# Patient Record
Sex: Male | Born: 1950 | Race: White | Hispanic: No | Marital: Married | Smoking: Current every day smoker
Health system: Southern US, Community
[De-identification: ages and names within clinical notes are randomized; demographics above are authoritative.]

## PROBLEM LIST (undated history)

## (undated) DIAGNOSIS — E785 Hyperlipidemia, unspecified: Secondary | ICD-10-CM

## (undated) HISTORY — PX: SHOULDER SURGERY: SHX246

## (undated) HISTORY — DX: Hyperlipidemia, unspecified: E78.5

---

## 2000-12-12 ENCOUNTER — Encounter: Payer: Self-pay | Admitting: Specialist

## 2000-12-12 ENCOUNTER — Ambulatory Visit (HOSPITAL_COMMUNITY): Admission: RE | Admit: 2000-12-12 | Discharge: 2000-12-12 | Payer: Self-pay | Admitting: Specialist

## 2001-01-18 ENCOUNTER — Encounter: Payer: Self-pay | Admitting: Specialist

## 2001-01-18 ENCOUNTER — Observation Stay (HOSPITAL_COMMUNITY): Admission: RE | Admit: 2001-01-18 | Discharge: 2001-01-19 | Payer: Self-pay | Admitting: Specialist

## 2004-01-14 ENCOUNTER — Ambulatory Visit (HOSPITAL_COMMUNITY): Admission: RE | Admit: 2004-01-14 | Discharge: 2004-01-14 | Payer: Self-pay | Admitting: *Deleted

## 2012-12-18 HISTORY — PX: OTHER SURGICAL HISTORY: SHX169

## 2014-10-01 ENCOUNTER — Other Ambulatory Visit: Payer: Self-pay | Admitting: Family Medicine

## 2014-10-01 DIAGNOSIS — R0600 Dyspnea, unspecified: Secondary | ICD-10-CM

## 2014-10-02 ENCOUNTER — Ambulatory Visit (INDEPENDENT_AMBULATORY_CARE_PROVIDER_SITE_OTHER): Payer: PRIVATE HEALTH INSURANCE | Admitting: Internal Medicine

## 2014-10-02 DIAGNOSIS — R0600 Dyspnea, unspecified: Secondary | ICD-10-CM

## 2014-10-02 LAB — PULMONARY FUNCTION TEST
DL/VA % pred: 94 %
DL/VA: 4.41 ml/min/mmHg/L
DLCO unc % pred: 83 %
DLCO unc: 27.58 ml/min/mmHg
FEF 25-75 Post: 1.59 L/sec
FEF 25-75 Pre: 0.95 L/sec
FEF2575-%Change-Post: 67 %
FEF2575-%Pred-Post: 55 %
FEF2575-%Pred-Pre: 33 %
FEV1-%Change-Post: 24 %
FEV1-%Pred-Post: 63 %
FEV1-%Pred-Pre: 51 %
FEV1-Post: 2.26 L
FEV1-Pre: 1.82 L
FEV1FVC-%Change-Post: 8 %
FEV1FVC-%Pred-Pre: 75 %
FEV6-%Change-Post: 16 %
FEV6-%Pred-Post: 80 %
FEV6-%Pred-Pre: 68 %
FEV6-Post: 3.62 L
FEV6-Pre: 3.11 L
FEV6FVC-%Change-Post: 1 %
FEV6FVC-%Pred-Post: 103 %
FEV6FVC-%Pred-Pre: 102 %
FVC-%Change-Post: 14 %
FVC-%Pred-Post: 77 %
FVC-%Pred-Pre: 67 %
FVC-Post: 3.68 L
FVC-Pre: 3.21 L
Post FEV1/FVC ratio: 62 %
Post FEV6/FVC ratio: 98 %
Pre FEV1/FVC ratio: 57 %
Pre FEV6/FVC Ratio: 97 %

## 2014-10-02 NOTE — Progress Notes (Signed)
PFT done today. 

## 2016-09-15 DIAGNOSIS — Z23 Encounter for immunization: Secondary | ICD-10-CM | POA: Diagnosis not present

## 2016-12-12 DIAGNOSIS — L03114 Cellulitis of left upper limb: Secondary | ICD-10-CM | POA: Diagnosis not present

## 2016-12-12 DIAGNOSIS — M7022 Olecranon bursitis, left elbow: Secondary | ICD-10-CM | POA: Diagnosis not present

## 2017-07-25 DIAGNOSIS — Z125 Encounter for screening for malignant neoplasm of prostate: Secondary | ICD-10-CM | POA: Diagnosis not present

## 2017-07-25 DIAGNOSIS — J45909 Unspecified asthma, uncomplicated: Secondary | ICD-10-CM | POA: Diagnosis not present

## 2017-07-25 DIAGNOSIS — E669 Obesity, unspecified: Secondary | ICD-10-CM | POA: Diagnosis not present

## 2017-07-25 DIAGNOSIS — F17200 Nicotine dependence, unspecified, uncomplicated: Secondary | ICD-10-CM | POA: Diagnosis not present

## 2017-07-25 DIAGNOSIS — E785 Hyperlipidemia, unspecified: Secondary | ICD-10-CM | POA: Diagnosis not present

## 2017-07-25 DIAGNOSIS — Z23 Encounter for immunization: Secondary | ICD-10-CM | POA: Diagnosis not present

## 2017-07-25 DIAGNOSIS — Z0000 Encounter for general adult medical examination without abnormal findings: Secondary | ICD-10-CM | POA: Diagnosis not present

## 2017-07-25 DIAGNOSIS — I10 Essential (primary) hypertension: Secondary | ICD-10-CM | POA: Diagnosis not present

## 2017-07-25 DIAGNOSIS — M1990 Unspecified osteoarthritis, unspecified site: Secondary | ICD-10-CM | POA: Diagnosis not present

## 2017-07-26 ENCOUNTER — Other Ambulatory Visit: Payer: Self-pay | Admitting: Family Medicine

## 2017-07-26 DIAGNOSIS — Z136 Encounter for screening for cardiovascular disorders: Secondary | ICD-10-CM

## 2017-07-26 DIAGNOSIS — Z87891 Personal history of nicotine dependence: Secondary | ICD-10-CM

## 2017-08-07 ENCOUNTER — Ambulatory Visit
Admission: RE | Admit: 2017-08-07 | Discharge: 2017-08-07 | Disposition: A | Discharge status: Home / Self Care | Payer: Medicare Other | Source: Ambulatory Visit | Attending: Family Medicine | Admitting: Family Medicine

## 2017-08-07 DIAGNOSIS — Z136 Encounter for screening for cardiovascular disorders: Secondary | ICD-10-CM

## 2017-08-07 DIAGNOSIS — Z87891 Personal history of nicotine dependence: Secondary | ICD-10-CM

## 2017-09-05 DIAGNOSIS — H35033 Hypertensive retinopathy, bilateral: Secondary | ICD-10-CM | POA: Diagnosis not present

## 2017-09-06 DIAGNOSIS — H35371 Puckering of macula, right eye: Secondary | ICD-10-CM | POA: Diagnosis not present

## 2017-09-06 DIAGNOSIS — H4311 Vitreous hemorrhage, right eye: Secondary | ICD-10-CM | POA: Diagnosis not present

## 2017-09-06 DIAGNOSIS — H43813 Vitreous degeneration, bilateral: Secondary | ICD-10-CM | POA: Diagnosis not present

## 2017-09-13 DIAGNOSIS — H43813 Vitreous degeneration, bilateral: Secondary | ICD-10-CM | POA: Diagnosis not present

## 2017-09-13 DIAGNOSIS — H35371 Puckering of macula, right eye: Secondary | ICD-10-CM | POA: Diagnosis not present

## 2017-09-13 DIAGNOSIS — H4311 Vitreous hemorrhage, right eye: Secondary | ICD-10-CM | POA: Diagnosis not present

## 2017-09-18 DIAGNOSIS — K648 Other hemorrhoids: Secondary | ICD-10-CM | POA: Diagnosis not present

## 2017-09-18 DIAGNOSIS — K644 Residual hemorrhoidal skin tags: Secondary | ICD-10-CM | POA: Diagnosis not present

## 2017-09-18 DIAGNOSIS — D126 Benign neoplasm of colon, unspecified: Secondary | ICD-10-CM | POA: Diagnosis not present

## 2017-09-18 DIAGNOSIS — K5730 Diverticulosis of large intestine without perforation or abscess without bleeding: Secondary | ICD-10-CM | POA: Diagnosis not present

## 2017-09-18 DIAGNOSIS — Z1211 Encounter for screening for malignant neoplasm of colon: Secondary | ICD-10-CM | POA: Diagnosis not present

## 2017-09-20 DIAGNOSIS — D126 Benign neoplasm of colon, unspecified: Secondary | ICD-10-CM | POA: Diagnosis not present

## 2017-09-20 DIAGNOSIS — Z1211 Encounter for screening for malignant neoplasm of colon: Secondary | ICD-10-CM | POA: Diagnosis not present

## 2017-09-24 DIAGNOSIS — Z23 Encounter for immunization: Secondary | ICD-10-CM | POA: Diagnosis not present

## 2017-09-24 DIAGNOSIS — M7022 Olecranon bursitis, left elbow: Secondary | ICD-10-CM | POA: Diagnosis not present

## 2017-10-05 DIAGNOSIS — M75122 Complete rotator cuff tear or rupture of left shoulder, not specified as traumatic: Secondary | ICD-10-CM | POA: Diagnosis not present

## 2017-10-05 DIAGNOSIS — M25522 Pain in left elbow: Secondary | ICD-10-CM | POA: Diagnosis not present

## 2017-10-25 DIAGNOSIS — H35371 Puckering of macula, right eye: Secondary | ICD-10-CM | POA: Diagnosis not present

## 2017-10-25 DIAGNOSIS — H4311 Vitreous hemorrhage, right eye: Secondary | ICD-10-CM | POA: Diagnosis not present

## 2017-10-25 DIAGNOSIS — H43391 Other vitreous opacities, right eye: Secondary | ICD-10-CM | POA: Diagnosis not present

## 2017-10-25 DIAGNOSIS — H43813 Vitreous degeneration, bilateral: Secondary | ICD-10-CM | POA: Diagnosis not present

## 2018-01-21 ENCOUNTER — Telehealth: Payer: Self-pay | Admitting: Podiatry

## 2018-01-21 ENCOUNTER — Encounter: Payer: Self-pay | Admitting: Podiatry

## 2018-01-21 ENCOUNTER — Ambulatory Visit (INDEPENDENT_AMBULATORY_CARE_PROVIDER_SITE_OTHER): Payer: Medicare Other

## 2018-01-21 ENCOUNTER — Ambulatory Visit (INDEPENDENT_AMBULATORY_CARE_PROVIDER_SITE_OTHER): Payer: Medicare Other | Admitting: Podiatry

## 2018-01-21 VITALS — BP 130/94 | HR 77 | Temp 97.9°F | Resp 18

## 2018-01-21 DIAGNOSIS — M722 Plantar fascial fibromatosis: Secondary | ICD-10-CM | POA: Diagnosis not present

## 2018-01-21 MED ORDER — DICLOFENAC SODIUM 75 MG PO TBEC: 75.0000 mg | DELAYED_RELEASE_TABLET | Freq: Two times a day (BID) | ORAL | 3 refills | Status: DC

## 2018-01-21 MED ORDER — METHYLPREDNISOLONE 4 MG PO TBPK: ORAL_TABLET | ORAL | 0 refills | Status: DC

## 2018-01-21 MED ORDER — CELECOXIB 100 MG PO CAPS
100.0000 mg | ORAL_CAPSULE | Freq: Two times a day (BID) | ORAL | 3 refills | Status: DC
Start: 2018-01-21 — End: 2018-04-15

## 2018-01-21 NOTE — Progress Notes (Signed)
Subjective:  Patient ID: Michael Ballard, male    DOB: 1951-12-17,  MRN: [id number] HPI Chief Complaint  Patient presents with  . Foot Pain    Patient presents today for bilat feet/heel pain x 1 year off and on.  He reports right is much worse than left foot and when he walks, he has sharp stabbing pains and is worse after sitting for a while.  He has used ice and Epson salt soaks with some relief.  He also has severe pes planus bilat    67 y.o. male presents with the above complaint.     No past medical history on file.   Current Outpatient Medications:  .  amLODipine (NORVASC) 10 MG tablet, TK 1 T PO QD, Disp: , Rfl: 3 .  buPROPion (WELLBUTRIN XL) 150 MG 24 hr tablet, TK 1 T PO QAM TO HELP REDUCE SMOKING URGES, Disp: , Rfl: 7 .  meloxicam (MOBIC) 15 MG tablet, TK 1 T PO QD IN THE MORNING WF P, Disp: , Rfl: 3  No Known Allergies Review of Systems  Musculoskeletal: Positive for gait problem.  All other systems reviewed and are negative.  Objective:   Vitals:   01/21/18 1026  BP: (!) 130/94  Pulse: 77  Resp: 18  Temp: 97.9 F (36.6 C)    General: Well developed, nourished, in no acute distress, alert and oriented x3   Dermatological: Skin is warm, dry and supple bilateral. Nails x 10 are well maintained; remaining integument appears unremarkable at this time. There are no open sores, no preulcerative lesions, no rash or signs of infection present.  Vascular: Dorsalis Pedis artery and Posterior Tibial artery pedal pulses are 2/4 bilateral with immedate capillary fill time. Pedal hair growth present. No varicosities and no lower extremity edema present bilateral.   Neruologic: Grossly intact via light touch bilateral. Vibratory intact via tuning fork bilateral. Protective threshold with Semmes Wienstein monofilament intact to all pedal sites bilateral. Patellar and Achilles deep tendon reflexes 2+ bilateral. No Babinski or clonus noted bilateral.   Musculoskeletal: No  gross boney pedal deformities bilateral. No pain, crepitus, or limitation noted with foot and ankle range of motion bilateral. Muscular strength 5/5 in all groups tested bilateral.  Severe pes planus with pain on palpation of Lisfranc's joints.  He also has pain on palpation medial calcaneal tubercle of the right heel.   Gait: Unassisted, Nonantalgic.    Radiographs:  3 radiographs of the bilateral foot were taken today in the office demonstrating no acute findings.  However soft tissue increase in density at the plantar fascial kidney insertion site of the right heel is noted.  Severe flatfoot deformity is noted right greater than left.  Significant osteoarthritic changes of not only the subtalar joint of the the mid tarsal joint and Lisfranc joints.  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  Plan: Discussed etiology pathology conservative versus surgical therapies.  At this point he is going to bring me the brace that Dr. Gershon Mussel provided for him previously.  He is also going to allow me to place a cortisone injection to his right heel today after verbal consent and he was injected with 20 mg of Kenalog 5 mg Marcaine to the point of maximal tenderness understood sterile Betadine skin prep right foot.  He tolerated procedure well without complications.  We discussed appropriate shoe gear stretching exercises ice therapy as your modifications.  Wrote him on  a Medrol Dosepak to be followed by Celebrex 100 mg twice a day.  I will follow-up with him in 1 month.  He will call sooner if needed    Michael Ballard T. [place], [place]

## 2018-01-21 NOTE — Telephone Encounter (Signed)
Patient was here today, Dr. Milinda Pointer called in Celebrex and he cannot afford it at his pharmacy. He would like something else called in that is similar of possible.

## 2018-01-21 NOTE — Telephone Encounter (Signed)
He could try diclofenac 75 mg bid #60 with 3 RF

## 2018-01-21 NOTE — Telephone Encounter (Signed)
Per Dr. Milinda Pointer, script for Diclofenac has been sent to pharmacy and patient has been notified of new script

## 2018-01-21 NOTE — Addendum Note (Signed)
Addended by: Graceann Congress D on: 01/21/2018 03:19 PM   Modules accepted: Orders

## 2018-01-30 DIAGNOSIS — M4722 Other spondylosis with radiculopathy, cervical region: Secondary | ICD-10-CM | POA: Diagnosis not present

## 2018-02-27 ENCOUNTER — Ambulatory Visit (INDEPENDENT_AMBULATORY_CARE_PROVIDER_SITE_OTHER): Payer: Medicare Other | Admitting: Podiatry

## 2018-02-27 ENCOUNTER — Encounter: Payer: Self-pay | Admitting: Podiatry

## 2018-02-27 DIAGNOSIS — M722 Plantar fascial fibromatosis: Secondary | ICD-10-CM

## 2018-02-27 NOTE — Progress Notes (Signed)
He presents today for follow-up of his plantar fasciitis states that he feels a little better.  Pains are not as sharp as they were the diclofenac seems to be helping.  Objective: Vital signs are stable alert and oriented x3.  Pulses are strongly palpable.  Neurologic sensorium is intact.  He has pain on palpation medial calcaneal tubercles bilateral.  No open lesions or wounds.  Assessment:.  Resolving plantar fasciitis bilateral.  Plan: Discussed etiology pathology conservative versus surgical therapies.  After consent I injected the bilateral heels today 20 mg Kenalog 5 mg Marcaine point maximal tenderness medial aspect of bilateral heel.  Tolerated procedure well without complications.  Follow-up with me 1 month continue all other conservative therapies.

## 2018-03-01 DIAGNOSIS — M25522 Pain in left elbow: Secondary | ICD-10-CM | POA: Diagnosis not present

## 2018-03-05 DIAGNOSIS — J0100 Acute maxillary sinusitis, unspecified: Secondary | ICD-10-CM | POA: Diagnosis not present

## 2018-03-05 DIAGNOSIS — J45909 Unspecified asthma, uncomplicated: Secondary | ICD-10-CM | POA: Diagnosis not present

## 2018-04-15 ENCOUNTER — Encounter: Payer: Self-pay | Admitting: Podiatry

## 2018-04-15 ENCOUNTER — Ambulatory Visit (INDEPENDENT_AMBULATORY_CARE_PROVIDER_SITE_OTHER): Payer: Medicare Other | Admitting: Podiatry

## 2018-04-15 DIAGNOSIS — M779 Enthesopathy, unspecified: Secondary | ICD-10-CM

## 2018-04-15 DIAGNOSIS — M722 Plantar fascial fibromatosis: Secondary | ICD-10-CM | POA: Diagnosis not present

## 2018-04-15 DIAGNOSIS — M7750 Other enthesopathy of unspecified foot: Secondary | ICD-10-CM

## 2018-04-15 MED ORDER — CELECOXIB 100 MG PO CAPS: 100.0000 mg | ORAL_CAPSULE | Freq: Two times a day (BID) | ORAL | 3 refills | Status: DC

## 2018-04-15 NOTE — Progress Notes (Signed)
He presents today for follow-up of plantar fasciitis.  He states that the fasciitis is doing much better however I have pain lighting here as he refers to the sinus tarsi and that really has not improved at all.  He also states that he did not start taking the Celebrex.  He is wondering if taking Celebrex would help.  Objective:  Objective: Vital signs are stable alert and oriented x3 pulses are palpable.  He has severe pes planus with pain on palpation to the sinus tarsi of the bilateral foot minimal tenderness on palpation of the plantar fascia at its insertion site of the heel.  Assessment: Letter fasciitis with subtalar joint capsulitis.  Plan: After sterile Betadine skin prep I injected 20 mg Kenalog 5 mg Marcaine to the bilateral sinus tarsi's today.  He tolerated procedure well without complications.  I recommended that he start back on his Celebrex.  I will follow-up with him in 1 month if necessary.

## 2018-05-27 ENCOUNTER — Ambulatory Visit (INDEPENDENT_AMBULATORY_CARE_PROVIDER_SITE_OTHER): Payer: Medicare Other | Admitting: Podiatry

## 2018-05-27 ENCOUNTER — Encounter: Payer: Self-pay | Admitting: Podiatry

## 2018-05-27 DIAGNOSIS — M779 Enthesopathy, unspecified: Secondary | ICD-10-CM

## 2018-05-27 DIAGNOSIS — M7750 Other enthesopathy of unspecified foot: Secondary | ICD-10-CM

## 2018-05-27 NOTE — Progress Notes (Signed)
He presents today for follow-up of his subtalar joint capsulitis bilaterally states that they really doing pretty good but that Celebrex may be swell so up stop taking it.  Objective: Vital signs are stable he is alert and oriented x3 no pain on palpation of the sinus tarsi's bilateral or range of motion of the subtalar joints.  Severe posterior tibial tendon dysfunction with pes planus.  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  Resolution of capsulitis.  Plan: I will refer him to Liliane Channel to consider [place] braces and discussed this thoroughly with him he will follow-up with me after that.  I did put him in a compression anklet he tolerates that well.

## 2018-06-05 ENCOUNTER — Ambulatory Visit: Payer: Medicare Other | Admitting: Orthotics

## 2018-06-05 DIAGNOSIS — M722 Plantar fascial fibromatosis: Secondary | ICD-10-CM

## 2018-06-05 DIAGNOSIS — M7750 Other enthesopathy of unspecified foot: Secondary | ICD-10-CM

## 2018-06-05 NOTE — Progress Notes (Signed)
Patient was evaluated today for [place] brace due to gait instabilty, fear of falling, and severe PTTD/pes planus

## 2018-06-11 DIAGNOSIS — R319 Hematuria, unspecified: Secondary | ICD-10-CM | POA: Diagnosis not present

## 2018-06-11 DIAGNOSIS — Z720 Tobacco use: Secondary | ICD-10-CM | POA: Diagnosis not present

## 2018-06-12 DIAGNOSIS — R262 Difficulty in walking, not elsewhere classified: Secondary | ICD-10-CM | POA: Diagnosis not present

## 2018-06-12 DIAGNOSIS — M25571 Pain in right ankle and joints of right foot: Secondary | ICD-10-CM | POA: Diagnosis not present

## 2018-06-12 DIAGNOSIS — M25572 Pain in left ankle and joints of left foot: Secondary | ICD-10-CM | POA: Diagnosis not present

## 2018-06-13 ENCOUNTER — Other Ambulatory Visit: Payer: Self-pay | Admitting: Family Medicine

## 2018-06-13 DIAGNOSIS — R319 Hematuria, unspecified: Secondary | ICD-10-CM

## 2018-06-13 DIAGNOSIS — Z720 Tobacco use: Secondary | ICD-10-CM

## 2018-06-14 ENCOUNTER — Ambulatory Visit: Payer: Medicare Other | Admitting: Orthotics

## 2018-06-14 DIAGNOSIS — M7750 Other enthesopathy of unspecified foot: Secondary | ICD-10-CM

## 2018-06-14 NOTE — Progress Notes (Signed)
Patient presents today for evaluation/casting for AFO brace (tR).   Patient has hx of the following conditions: Gait instability,  Ankle instabilty,  Gait analysis done and patient displays abnormality of gait in both sagittial and frontal planes, and could benefit in aggressive ankle support.  Patient chose [place] brace w/ lace/speed laces.   Once he recv brace, he may be casted for L as well.

## 2018-06-17 ENCOUNTER — Other Ambulatory Visit: Payer: Self-pay | Admitting: Family Medicine

## 2018-06-17 DIAGNOSIS — R319 Hematuria, unspecified: Secondary | ICD-10-CM

## 2018-06-18 DIAGNOSIS — R262 Difficulty in walking, not elsewhere classified: Secondary | ICD-10-CM | POA: Diagnosis not present

## 2018-06-18 DIAGNOSIS — M25572 Pain in left ankle and joints of left foot: Secondary | ICD-10-CM | POA: Diagnosis not present

## 2018-06-18 DIAGNOSIS — M25571 Pain in right ankle and joints of right foot: Secondary | ICD-10-CM | POA: Diagnosis not present

## 2018-06-21 DIAGNOSIS — M25572 Pain in left ankle and joints of left foot: Secondary | ICD-10-CM | POA: Diagnosis not present

## 2018-06-21 DIAGNOSIS — M25571 Pain in right ankle and joints of right foot: Secondary | ICD-10-CM | POA: Diagnosis not present

## 2018-06-21 DIAGNOSIS — R262 Difficulty in walking, not elsewhere classified: Secondary | ICD-10-CM | POA: Diagnosis not present

## 2018-06-24 ENCOUNTER — Ambulatory Visit
Admission: RE | Admit: 2018-06-24 | Discharge: 2018-06-24 | Disposition: A | Discharge status: Home / Self Care | Payer: Medicare Other | Source: Ambulatory Visit | Attending: Family Medicine | Admitting: Family Medicine

## 2018-06-24 DIAGNOSIS — R319 Hematuria, unspecified: Secondary | ICD-10-CM

## 2018-06-25 DIAGNOSIS — M25572 Pain in left ankle and joints of left foot: Secondary | ICD-10-CM | POA: Diagnosis not present

## 2018-06-25 DIAGNOSIS — R262 Difficulty in walking, not elsewhere classified: Secondary | ICD-10-CM | POA: Diagnosis not present

## 2018-06-25 DIAGNOSIS — M25571 Pain in right ankle and joints of right foot: Secondary | ICD-10-CM | POA: Diagnosis not present

## 2018-06-27 DIAGNOSIS — M25571 Pain in right ankle and joints of right foot: Secondary | ICD-10-CM | POA: Diagnosis not present

## 2018-06-27 DIAGNOSIS — R262 Difficulty in walking, not elsewhere classified: Secondary | ICD-10-CM | POA: Diagnosis not present

## 2018-06-27 DIAGNOSIS — M25572 Pain in left ankle and joints of left foot: Secondary | ICD-10-CM | POA: Diagnosis not present

## 2018-07-02 DIAGNOSIS — M25571 Pain in right ankle and joints of right foot: Secondary | ICD-10-CM | POA: Diagnosis not present

## 2018-07-02 DIAGNOSIS — R262 Difficulty in walking, not elsewhere classified: Secondary | ICD-10-CM | POA: Diagnosis not present

## 2018-07-02 DIAGNOSIS — M25572 Pain in left ankle and joints of left foot: Secondary | ICD-10-CM | POA: Diagnosis not present

## 2018-07-09 DIAGNOSIS — M25572 Pain in left ankle and joints of left foot: Secondary | ICD-10-CM | POA: Diagnosis not present

## 2018-07-09 DIAGNOSIS — R262 Difficulty in walking, not elsewhere classified: Secondary | ICD-10-CM | POA: Diagnosis not present

## 2018-07-09 DIAGNOSIS — M25571 Pain in right ankle and joints of right foot: Secondary | ICD-10-CM | POA: Diagnosis not present

## 2018-07-11 DIAGNOSIS — R262 Difficulty in walking, not elsewhere classified: Secondary | ICD-10-CM | POA: Diagnosis not present

## 2018-07-11 DIAGNOSIS — M25572 Pain in left ankle and joints of left foot: Secondary | ICD-10-CM | POA: Diagnosis not present

## 2018-07-11 DIAGNOSIS — M25571 Pain in right ankle and joints of right foot: Secondary | ICD-10-CM | POA: Diagnosis not present

## 2018-07-22 DIAGNOSIS — Z125 Encounter for screening for malignant neoplasm of prostate: Secondary | ICD-10-CM | POA: Diagnosis not present

## 2018-07-22 DIAGNOSIS — R310 Gross hematuria: Secondary | ICD-10-CM | POA: Diagnosis not present

## 2018-07-22 DIAGNOSIS — D3500 Benign neoplasm of unspecified adrenal gland: Secondary | ICD-10-CM | POA: Diagnosis not present

## 2018-07-23 DIAGNOSIS — M25572 Pain in left ankle and joints of left foot: Secondary | ICD-10-CM | POA: Diagnosis not present

## 2018-07-23 DIAGNOSIS — R262 Difficulty in walking, not elsewhere classified: Secondary | ICD-10-CM | POA: Diagnosis not present

## 2018-07-23 DIAGNOSIS — M25571 Pain in right ankle and joints of right foot: Secondary | ICD-10-CM | POA: Diagnosis not present

## 2018-07-24 ENCOUNTER — Ambulatory Visit (INDEPENDENT_AMBULATORY_CARE_PROVIDER_SITE_OTHER): Payer: Medicare Other | Admitting: Orthotics

## 2018-07-24 DIAGNOSIS — M779 Enthesopathy, unspecified: Secondary | ICD-10-CM

## 2018-07-24 DIAGNOSIS — M2140 Flat foot [pes planus] (acquired), unspecified foot: Secondary | ICD-10-CM

## 2018-07-24 DIAGNOSIS — M76822 Posterior tibial tendinitis, left leg: Secondary | ICD-10-CM

## 2018-07-24 DIAGNOSIS — M722 Plantar fascial fibromatosis: Secondary | ICD-10-CM

## 2018-07-24 DIAGNOSIS — M76821 Posterior tibial tendinitis, right leg: Secondary | ICD-10-CM

## 2018-07-24 DIAGNOSIS — M7750 Other enthesopathy of unspecified foot: Secondary | ICD-10-CM

## 2018-07-24 NOTE — Progress Notes (Signed)
Patient came in today to pick up standard Afo brace.  Patient was evaluated for fit and function.   The brace fit very well and there were any complaints of the way it felt once donned.  The brace offered ankle stability in both saggital and coroneal planes.  Patient advised to always wear proper fitting shoes with brace. 

## 2018-07-25 DIAGNOSIS — R262 Difficulty in walking, not elsewhere classified: Secondary | ICD-10-CM | POA: Diagnosis not present

## 2018-07-25 DIAGNOSIS — M25572 Pain in left ankle and joints of left foot: Secondary | ICD-10-CM | POA: Diagnosis not present

## 2018-07-25 DIAGNOSIS — M25571 Pain in right ankle and joints of right foot: Secondary | ICD-10-CM | POA: Diagnosis not present

## 2018-07-25 DIAGNOSIS — D3500 Benign neoplasm of unspecified adrenal gland: Secondary | ICD-10-CM | POA: Diagnosis not present

## 2018-07-26 DIAGNOSIS — R310 Gross hematuria: Secondary | ICD-10-CM | POA: Diagnosis not present

## 2018-07-26 DIAGNOSIS — D3500 Benign neoplasm of unspecified adrenal gland: Secondary | ICD-10-CM | POA: Diagnosis not present

## 2018-07-26 DIAGNOSIS — R3129 Other microscopic hematuria: Secondary | ICD-10-CM | POA: Diagnosis not present

## 2018-07-30 DIAGNOSIS — R262 Difficulty in walking, not elsewhere classified: Secondary | ICD-10-CM | POA: Diagnosis not present

## 2018-07-30 DIAGNOSIS — M25572 Pain in left ankle and joints of left foot: Secondary | ICD-10-CM | POA: Diagnosis not present

## 2018-07-30 DIAGNOSIS — M25571 Pain in right ankle and joints of right foot: Secondary | ICD-10-CM | POA: Diagnosis not present

## 2018-08-01 DIAGNOSIS — M25571 Pain in right ankle and joints of right foot: Secondary | ICD-10-CM | POA: Diagnosis not present

## 2018-08-01 DIAGNOSIS — M25572 Pain in left ankle and joints of left foot: Secondary | ICD-10-CM | POA: Diagnosis not present

## 2018-08-01 DIAGNOSIS — R262 Difficulty in walking, not elsewhere classified: Secondary | ICD-10-CM | POA: Diagnosis not present

## 2018-08-06 DIAGNOSIS — M25571 Pain in right ankle and joints of right foot: Secondary | ICD-10-CM | POA: Diagnosis not present

## 2018-08-06 DIAGNOSIS — R262 Difficulty in walking, not elsewhere classified: Secondary | ICD-10-CM | POA: Diagnosis not present

## 2018-08-06 DIAGNOSIS — M25572 Pain in left ankle and joints of left foot: Secondary | ICD-10-CM | POA: Diagnosis not present

## 2018-08-08 DIAGNOSIS — M25572 Pain in left ankle and joints of left foot: Secondary | ICD-10-CM | POA: Diagnosis not present

## 2018-08-08 DIAGNOSIS — M25571 Pain in right ankle and joints of right foot: Secondary | ICD-10-CM | POA: Diagnosis not present

## 2018-08-08 DIAGNOSIS — R262 Difficulty in walking, not elsewhere classified: Secondary | ICD-10-CM | POA: Diagnosis not present

## 2018-08-13 DIAGNOSIS — I77819 Aortic ectasia, unspecified site: Secondary | ICD-10-CM | POA: Diagnosis not present

## 2018-08-13 DIAGNOSIS — I10 Essential (primary) hypertension: Secondary | ICD-10-CM | POA: Diagnosis not present

## 2018-08-13 DIAGNOSIS — M25572 Pain in left ankle and joints of left foot: Secondary | ICD-10-CM | POA: Diagnosis not present

## 2018-08-13 DIAGNOSIS — Z0000 Encounter for general adult medical examination without abnormal findings: Secondary | ICD-10-CM | POA: Diagnosis not present

## 2018-08-13 DIAGNOSIS — E785 Hyperlipidemia, unspecified: Secondary | ICD-10-CM | POA: Diagnosis not present

## 2018-08-13 DIAGNOSIS — R262 Difficulty in walking, not elsewhere classified: Secondary | ICD-10-CM | POA: Diagnosis not present

## 2018-08-13 DIAGNOSIS — Z23 Encounter for immunization: Secondary | ICD-10-CM | POA: Diagnosis not present

## 2018-08-13 DIAGNOSIS — M25571 Pain in right ankle and joints of right foot: Secondary | ICD-10-CM | POA: Diagnosis not present

## 2018-08-13 DIAGNOSIS — J410 Simple chronic bronchitis: Secondary | ICD-10-CM | POA: Diagnosis not present

## 2018-08-13 DIAGNOSIS — F17200 Nicotine dependence, unspecified, uncomplicated: Secondary | ICD-10-CM | POA: Diagnosis not present

## 2018-08-13 DIAGNOSIS — Z125 Encounter for screening for malignant neoplasm of prostate: Secondary | ICD-10-CM | POA: Diagnosis not present

## 2018-08-15 DIAGNOSIS — M25571 Pain in right ankle and joints of right foot: Secondary | ICD-10-CM | POA: Diagnosis not present

## 2018-08-15 DIAGNOSIS — R262 Difficulty in walking, not elsewhere classified: Secondary | ICD-10-CM | POA: Diagnosis not present

## 2018-08-15 DIAGNOSIS — M25572 Pain in left ankle and joints of left foot: Secondary | ICD-10-CM | POA: Diagnosis not present

## 2018-08-20 DIAGNOSIS — R262 Difficulty in walking, not elsewhere classified: Secondary | ICD-10-CM | POA: Diagnosis not present

## 2018-08-20 DIAGNOSIS — M25571 Pain in right ankle and joints of right foot: Secondary | ICD-10-CM | POA: Diagnosis not present

## 2018-08-20 DIAGNOSIS — M25572 Pain in left ankle and joints of left foot: Secondary | ICD-10-CM | POA: Diagnosis not present

## 2018-08-22 DIAGNOSIS — M25572 Pain in left ankle and joints of left foot: Secondary | ICD-10-CM | POA: Diagnosis not present

## 2018-08-22 DIAGNOSIS — R262 Difficulty in walking, not elsewhere classified: Secondary | ICD-10-CM | POA: Diagnosis not present

## 2018-08-22 DIAGNOSIS — M25571 Pain in right ankle and joints of right foot: Secondary | ICD-10-CM | POA: Diagnosis not present

## 2018-08-23 DIAGNOSIS — R310 Gross hematuria: Secondary | ICD-10-CM | POA: Diagnosis not present

## 2018-08-27 DIAGNOSIS — R262 Difficulty in walking, not elsewhere classified: Secondary | ICD-10-CM | POA: Diagnosis not present

## 2018-08-27 DIAGNOSIS — M25571 Pain in right ankle and joints of right foot: Secondary | ICD-10-CM | POA: Diagnosis not present

## 2018-08-27 DIAGNOSIS — M25572 Pain in left ankle and joints of left foot: Secondary | ICD-10-CM | POA: Diagnosis not present

## 2018-08-29 DIAGNOSIS — M25571 Pain in right ankle and joints of right foot: Secondary | ICD-10-CM | POA: Diagnosis not present

## 2018-08-29 DIAGNOSIS — R262 Difficulty in walking, not elsewhere classified: Secondary | ICD-10-CM | POA: Diagnosis not present

## 2018-08-29 DIAGNOSIS — M25572 Pain in left ankle and joints of left foot: Secondary | ICD-10-CM | POA: Diagnosis not present

## 2018-09-03 DIAGNOSIS — M25571 Pain in right ankle and joints of right foot: Secondary | ICD-10-CM | POA: Diagnosis not present

## 2018-09-03 DIAGNOSIS — M25572 Pain in left ankle and joints of left foot: Secondary | ICD-10-CM | POA: Diagnosis not present

## 2018-09-03 DIAGNOSIS — R262 Difficulty in walking, not elsewhere classified: Secondary | ICD-10-CM | POA: Diagnosis not present

## 2018-09-05 DIAGNOSIS — R262 Difficulty in walking, not elsewhere classified: Secondary | ICD-10-CM | POA: Diagnosis not present

## 2018-09-05 DIAGNOSIS — M25571 Pain in right ankle and joints of right foot: Secondary | ICD-10-CM | POA: Diagnosis not present

## 2018-09-05 DIAGNOSIS — M25572 Pain in left ankle and joints of left foot: Secondary | ICD-10-CM | POA: Diagnosis not present

## 2018-09-10 DIAGNOSIS — M25571 Pain in right ankle and joints of right foot: Secondary | ICD-10-CM | POA: Diagnosis not present

## 2018-09-10 DIAGNOSIS — R262 Difficulty in walking, not elsewhere classified: Secondary | ICD-10-CM | POA: Diagnosis not present

## 2018-09-10 DIAGNOSIS — M25572 Pain in left ankle and joints of left foot: Secondary | ICD-10-CM | POA: Diagnosis not present

## 2018-09-12 DIAGNOSIS — R262 Difficulty in walking, not elsewhere classified: Secondary | ICD-10-CM | POA: Diagnosis not present

## 2018-09-12 DIAGNOSIS — M25571 Pain in right ankle and joints of right foot: Secondary | ICD-10-CM | POA: Diagnosis not present

## 2018-09-12 DIAGNOSIS — M25572 Pain in left ankle and joints of left foot: Secondary | ICD-10-CM | POA: Diagnosis not present

## 2018-09-17 DIAGNOSIS — R262 Difficulty in walking, not elsewhere classified: Secondary | ICD-10-CM | POA: Diagnosis not present

## 2018-09-17 DIAGNOSIS — M25572 Pain in left ankle and joints of left foot: Secondary | ICD-10-CM | POA: Diagnosis not present

## 2018-09-17 DIAGNOSIS — M25571 Pain in right ankle and joints of right foot: Secondary | ICD-10-CM | POA: Diagnosis not present

## 2018-09-19 DIAGNOSIS — L570 Actinic keratosis: Secondary | ICD-10-CM | POA: Diagnosis not present

## 2018-09-19 DIAGNOSIS — L821 Other seborrheic keratosis: Secondary | ICD-10-CM | POA: Diagnosis not present

## 2018-09-19 DIAGNOSIS — Z85828 Personal history of other malignant neoplasm of skin: Secondary | ICD-10-CM | POA: Diagnosis not present

## 2018-09-19 DIAGNOSIS — C44319 Basal cell carcinoma of skin of other parts of face: Secondary | ICD-10-CM | POA: Diagnosis not present

## 2018-09-19 DIAGNOSIS — R262 Difficulty in walking, not elsewhere classified: Secondary | ICD-10-CM | POA: Diagnosis not present

## 2018-09-19 DIAGNOSIS — M25571 Pain in right ankle and joints of right foot: Secondary | ICD-10-CM | POA: Diagnosis not present

## 2018-09-19 DIAGNOSIS — D225 Melanocytic nevi of trunk: Secondary | ICD-10-CM | POA: Diagnosis not present

## 2018-09-19 DIAGNOSIS — M25572 Pain in left ankle and joints of left foot: Secondary | ICD-10-CM | POA: Diagnosis not present

## 2018-09-19 DIAGNOSIS — C4442 Squamous cell carcinoma of skin of scalp and neck: Secondary | ICD-10-CM | POA: Diagnosis not present

## 2018-09-19 DIAGNOSIS — C4401 Basal cell carcinoma of skin of lip: Secondary | ICD-10-CM | POA: Diagnosis not present

## 2018-09-24 DIAGNOSIS — M25571 Pain in right ankle and joints of right foot: Secondary | ICD-10-CM | POA: Diagnosis not present

## 2018-09-24 DIAGNOSIS — R262 Difficulty in walking, not elsewhere classified: Secondary | ICD-10-CM | POA: Diagnosis not present

## 2018-09-24 DIAGNOSIS — M25572 Pain in left ankle and joints of left foot: Secondary | ICD-10-CM | POA: Diagnosis not present

## 2018-09-26 DIAGNOSIS — M25572 Pain in left ankle and joints of left foot: Secondary | ICD-10-CM | POA: Diagnosis not present

## 2018-09-26 DIAGNOSIS — R262 Difficulty in walking, not elsewhere classified: Secondary | ICD-10-CM | POA: Diagnosis not present

## 2018-09-26 DIAGNOSIS — M25571 Pain in right ankle and joints of right foot: Secondary | ICD-10-CM | POA: Diagnosis not present

## 2018-10-01 DIAGNOSIS — M25571 Pain in right ankle and joints of right foot: Secondary | ICD-10-CM | POA: Diagnosis not present

## 2018-10-01 DIAGNOSIS — R262 Difficulty in walking, not elsewhere classified: Secondary | ICD-10-CM | POA: Diagnosis not present

## 2018-10-01 DIAGNOSIS — M25572 Pain in left ankle and joints of left foot: Secondary | ICD-10-CM | POA: Diagnosis not present

## 2018-10-03 DIAGNOSIS — R262 Difficulty in walking, not elsewhere classified: Secondary | ICD-10-CM | POA: Diagnosis not present

## 2018-10-03 DIAGNOSIS — M25571 Pain in right ankle and joints of right foot: Secondary | ICD-10-CM | POA: Diagnosis not present

## 2018-10-03 DIAGNOSIS — M25572 Pain in left ankle and joints of left foot: Secondary | ICD-10-CM | POA: Diagnosis not present

## 2018-10-08 DIAGNOSIS — M25572 Pain in left ankle and joints of left foot: Secondary | ICD-10-CM | POA: Diagnosis not present

## 2018-10-08 DIAGNOSIS — R262 Difficulty in walking, not elsewhere classified: Secondary | ICD-10-CM | POA: Diagnosis not present

## 2018-10-08 DIAGNOSIS — M25571 Pain in right ankle and joints of right foot: Secondary | ICD-10-CM | POA: Diagnosis not present

## 2018-10-10 DIAGNOSIS — M25572 Pain in left ankle and joints of left foot: Secondary | ICD-10-CM | POA: Diagnosis not present

## 2018-10-10 DIAGNOSIS — M25571 Pain in right ankle and joints of right foot: Secondary | ICD-10-CM | POA: Diagnosis not present

## 2018-10-10 DIAGNOSIS — R262 Difficulty in walking, not elsewhere classified: Secondary | ICD-10-CM | POA: Diagnosis not present

## 2018-10-15 DIAGNOSIS — R262 Difficulty in walking, not elsewhere classified: Secondary | ICD-10-CM | POA: Diagnosis not present

## 2018-10-15 DIAGNOSIS — M25572 Pain in left ankle and joints of left foot: Secondary | ICD-10-CM | POA: Diagnosis not present

## 2018-10-15 DIAGNOSIS — M25571 Pain in right ankle and joints of right foot: Secondary | ICD-10-CM | POA: Diagnosis not present

## 2018-10-17 DIAGNOSIS — M25572 Pain in left ankle and joints of left foot: Secondary | ICD-10-CM | POA: Diagnosis not present

## 2018-10-17 DIAGNOSIS — M25571 Pain in right ankle and joints of right foot: Secondary | ICD-10-CM | POA: Diagnosis not present

## 2018-10-17 DIAGNOSIS — R262 Difficulty in walking, not elsewhere classified: Secondary | ICD-10-CM | POA: Diagnosis not present

## 2018-10-23 DIAGNOSIS — C44329 Squamous cell carcinoma of skin of other parts of face: Secondary | ICD-10-CM | POA: Diagnosis not present

## 2018-10-23 DIAGNOSIS — Z85828 Personal history of other malignant neoplasm of skin: Secondary | ICD-10-CM | POA: Diagnosis not present

## 2018-10-23 DIAGNOSIS — L570 Actinic keratosis: Secondary | ICD-10-CM | POA: Diagnosis not present

## 2018-10-23 DIAGNOSIS — C44712 Basal cell carcinoma of skin of right lower limb, including hip: Secondary | ICD-10-CM | POA: Diagnosis not present

## 2018-10-23 DIAGNOSIS — L821 Other seborrheic keratosis: Secondary | ICD-10-CM | POA: Diagnosis not present

## 2018-10-29 DIAGNOSIS — C4401 Basal cell carcinoma of skin of lip: Secondary | ICD-10-CM | POA: Diagnosis not present

## 2018-10-29 DIAGNOSIS — Z85828 Personal history of other malignant neoplasm of skin: Secondary | ICD-10-CM | POA: Diagnosis not present

## 2018-12-23 DIAGNOSIS — L570 Actinic keratosis: Secondary | ICD-10-CM | POA: Diagnosis not present

## 2018-12-23 DIAGNOSIS — L821 Other seborrheic keratosis: Secondary | ICD-10-CM | POA: Diagnosis not present

## 2018-12-23 DIAGNOSIS — L720 Epidermal cyst: Secondary | ICD-10-CM | POA: Diagnosis not present

## 2018-12-23 DIAGNOSIS — Z85828 Personal history of other malignant neoplasm of skin: Secondary | ICD-10-CM | POA: Diagnosis not present

## 2019-01-08 DIAGNOSIS — M19011 Primary osteoarthritis, right shoulder: Secondary | ICD-10-CM | POA: Diagnosis not present

## 2019-01-08 DIAGNOSIS — M25522 Pain in left elbow: Secondary | ICD-10-CM | POA: Diagnosis not present

## 2019-01-08 DIAGNOSIS — M25521 Pain in right elbow: Secondary | ICD-10-CM | POA: Diagnosis not present

## 2019-04-23 DIAGNOSIS — L821 Other seborrheic keratosis: Secondary | ICD-10-CM | POA: Diagnosis not present

## 2019-04-23 DIAGNOSIS — L570 Actinic keratosis: Secondary | ICD-10-CM | POA: Diagnosis not present

## 2019-04-23 DIAGNOSIS — D225 Melanocytic nevi of trunk: Secondary | ICD-10-CM | POA: Diagnosis not present

## 2019-04-23 DIAGNOSIS — Z85828 Personal history of other malignant neoplasm of skin: Secondary | ICD-10-CM | POA: Diagnosis not present

## 2019-06-26 ENCOUNTER — Other Ambulatory Visit: Payer: Self-pay | Admitting: Family Medicine

## 2019-07-01 ENCOUNTER — Other Ambulatory Visit: Payer: Self-pay | Admitting: Family Medicine

## 2019-07-01 DIAGNOSIS — E278 Other specified disorders of adrenal gland: Secondary | ICD-10-CM

## 2019-07-09 ENCOUNTER — Ambulatory Visit
Admission: RE | Admit: 2019-07-09 | Discharge: 2019-07-09 | Disposition: A | Discharge status: Home / Self Care | Payer: Medicare Other | Source: Ambulatory Visit | Attending: Family Medicine | Admitting: Family Medicine

## 2019-07-09 ENCOUNTER — Other Ambulatory Visit: Payer: Self-pay

## 2019-07-09 DIAGNOSIS — E278 Other specified disorders of adrenal gland: Secondary | ICD-10-CM

## 2019-07-09 DIAGNOSIS — D3502 Benign neoplasm of left adrenal gland: Secondary | ICD-10-CM | POA: Diagnosis not present

## 2019-07-09 DIAGNOSIS — D3501 Benign neoplasm of right adrenal gland: Secondary | ICD-10-CM | POA: Diagnosis not present

## 2019-07-09 DIAGNOSIS — K5730 Diverticulosis of large intestine without perforation or abscess without bleeding: Secondary | ICD-10-CM | POA: Diagnosis not present

## 2019-07-17 DIAGNOSIS — K5790 Diverticulosis of intestine, part unspecified, without perforation or abscess without bleeding: Secondary | ICD-10-CM | POA: Diagnosis not present

## 2019-07-17 DIAGNOSIS — S22000A Wedge compression fracture of unspecified thoracic vertebra, initial encounter for closed fracture: Secondary | ICD-10-CM | POA: Diagnosis not present

## 2019-07-17 DIAGNOSIS — K7460 Unspecified cirrhosis of liver: Secondary | ICD-10-CM | POA: Diagnosis not present

## 2019-09-05 DIAGNOSIS — M542 Cervicalgia: Secondary | ICD-10-CM | POA: Diagnosis not present

## 2019-09-05 DIAGNOSIS — M546 Pain in thoracic spine: Secondary | ICD-10-CM | POA: Diagnosis not present

## 2019-10-08 DIAGNOSIS — Z23 Encounter for immunization: Secondary | ICD-10-CM | POA: Diagnosis not present

## 2019-10-28 DIAGNOSIS — D1801 Hemangioma of skin and subcutaneous tissue: Secondary | ICD-10-CM | POA: Diagnosis not present

## 2019-10-28 DIAGNOSIS — Z85828 Personal history of other malignant neoplasm of skin: Secondary | ICD-10-CM | POA: Diagnosis not present

## 2019-10-28 DIAGNOSIS — D0462 Carcinoma in situ of skin of left upper limb, including shoulder: Secondary | ICD-10-CM | POA: Diagnosis not present

## 2019-10-28 DIAGNOSIS — L821 Other seborrheic keratosis: Secondary | ICD-10-CM | POA: Diagnosis not present

## 2019-10-28 DIAGNOSIS — L570 Actinic keratosis: Secondary | ICD-10-CM | POA: Diagnosis not present

## 2019-11-10 ENCOUNTER — Other Ambulatory Visit: Payer: Self-pay

## 2019-11-10 DIAGNOSIS — Z20822 Contact with and (suspected) exposure to covid-19: Secondary | ICD-10-CM

## 2019-11-11 LAB — NOVEL CORONAVIRUS, NAA: SARS-CoV-2, NAA: NOT DETECTED

## 2020-05-04 DIAGNOSIS — L814 Other melanin hyperpigmentation: Secondary | ICD-10-CM | POA: Diagnosis not present

## 2020-05-04 DIAGNOSIS — D0471 Carcinoma in situ of skin of right lower limb, including hip: Secondary | ICD-10-CM | POA: Diagnosis not present

## 2020-05-04 DIAGNOSIS — D1801 Hemangioma of skin and subcutaneous tissue: Secondary | ICD-10-CM | POA: Diagnosis not present

## 2020-05-04 DIAGNOSIS — Z85828 Personal history of other malignant neoplasm of skin: Secondary | ICD-10-CM | POA: Diagnosis not present

## 2020-05-04 DIAGNOSIS — C44722 Squamous cell carcinoma of skin of right lower limb, including hip: Secondary | ICD-10-CM | POA: Diagnosis not present

## 2020-05-04 DIAGNOSIS — L570 Actinic keratosis: Secondary | ICD-10-CM | POA: Diagnosis not present

## 2020-05-04 DIAGNOSIS — D225 Melanocytic nevi of trunk: Secondary | ICD-10-CM | POA: Diagnosis not present

## 2020-05-04 DIAGNOSIS — L821 Other seborrheic keratosis: Secondary | ICD-10-CM | POA: Diagnosis not present

## 2020-07-02 IMAGING — CT CT ABD-PELV W/O CM
1 of 2 series · 14 of 32 positions shown, 19 images · non-contrast
Comparison: None.

CLINICAL DATA: Low back pain, hematuria.

EXAM:
CT ABDOMEN AND PELVIS WITHOUT CONTRAST
TECHNIQUE: Multidetector CT imaging of the abdomen and pelvis was performed
following the standard protocol without IV contrast.

[Series 2: renal standard/full · axial · 0.84mm/px · z∈[-356,+29]mm · 14 of 87 slices shown, 19 images]
[im 5/87  soft-tissue]
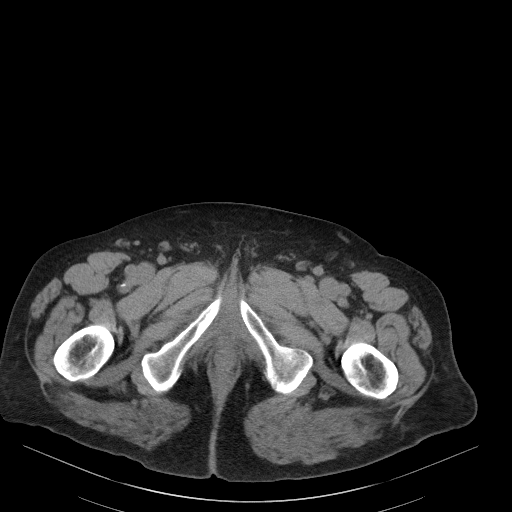
[im 5/87  bone]
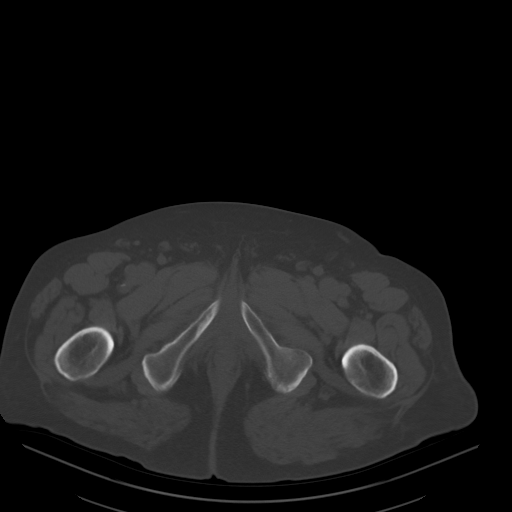
[im 10/87  soft-tissue]
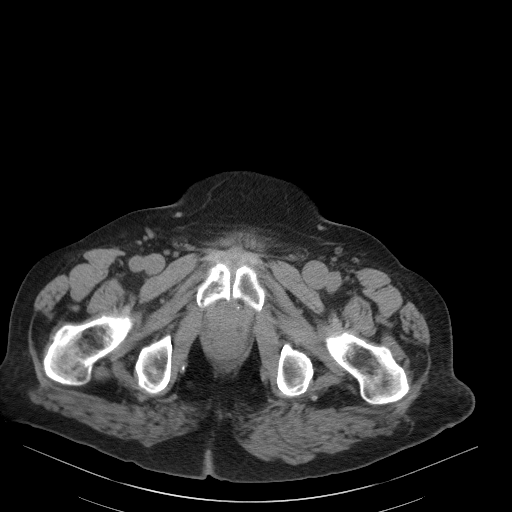
[im 20/87  soft-tissue]
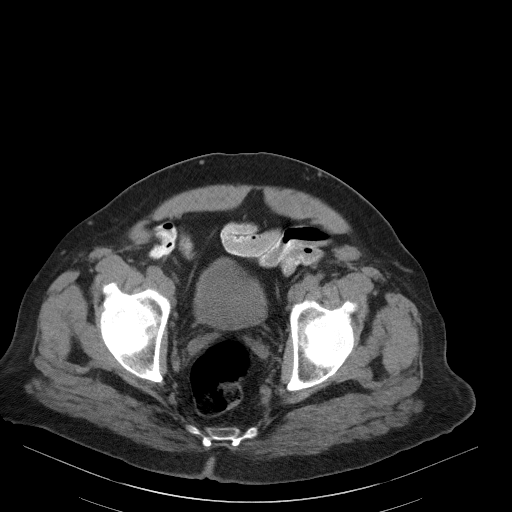
[im 24/87  soft-tissue]
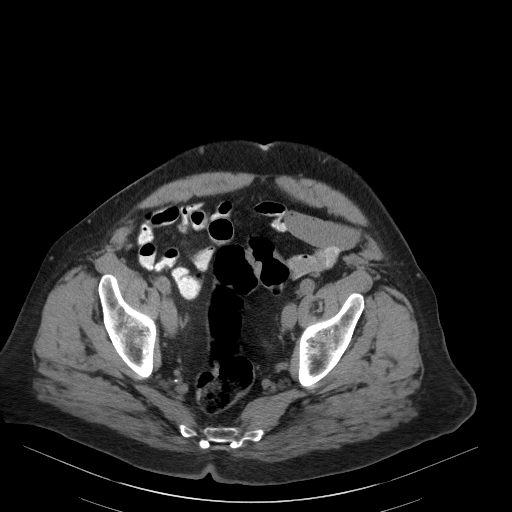
[im 29/87  soft-tissue]
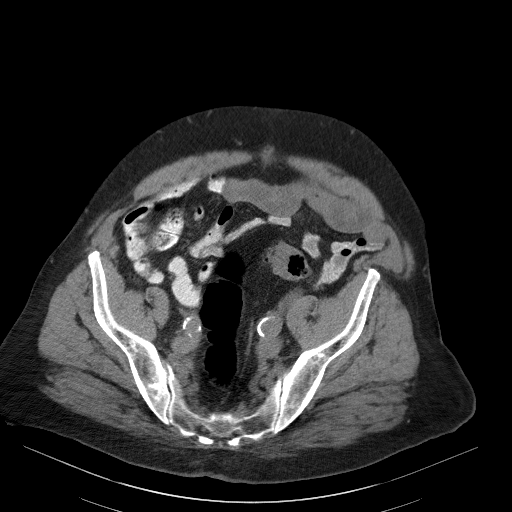
[im 39/87  soft-tissue]
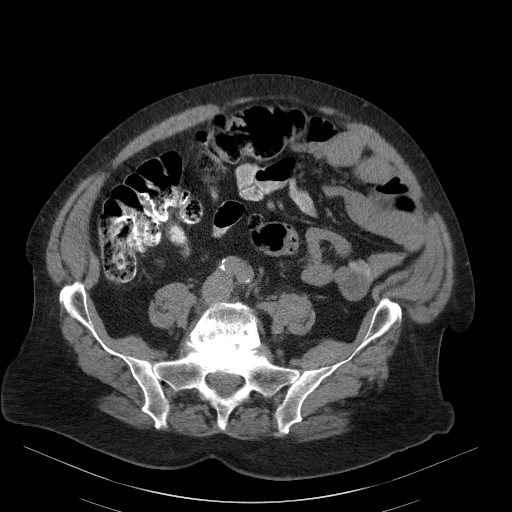
[im 44/87  soft-tissue]
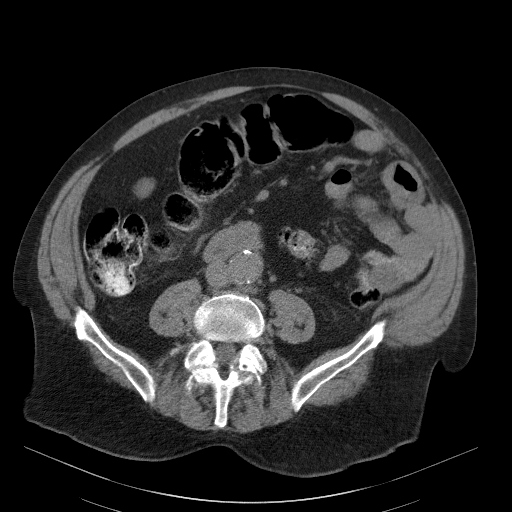
[im 48/87  soft-tissue]
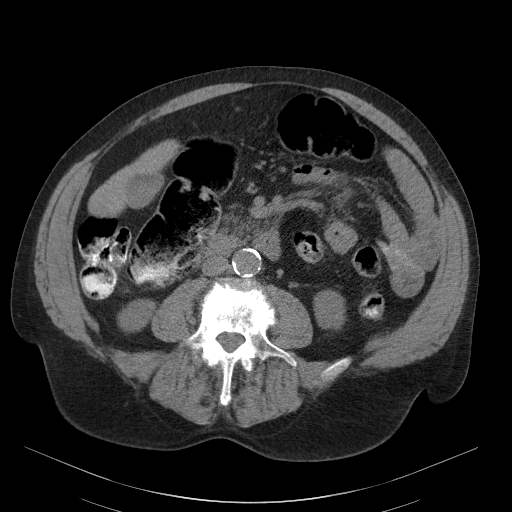
[im 58/87  soft-tissue]
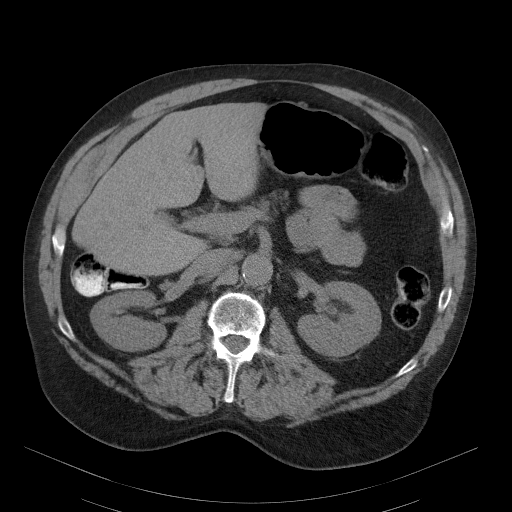
[im 58/87  bone]
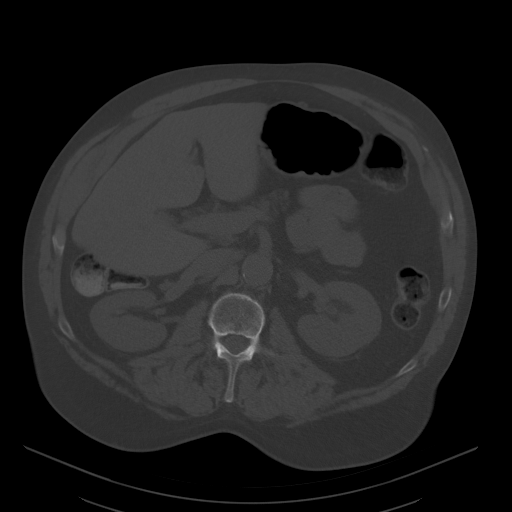
[im 63/87  soft-tissue]
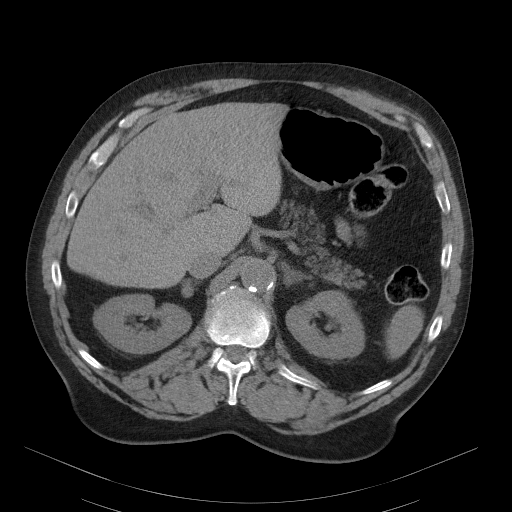
[im 67/87  soft-tissue]
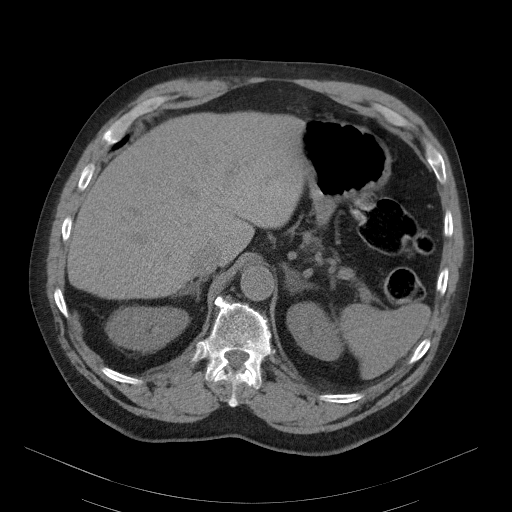
[im 67/87  lung]
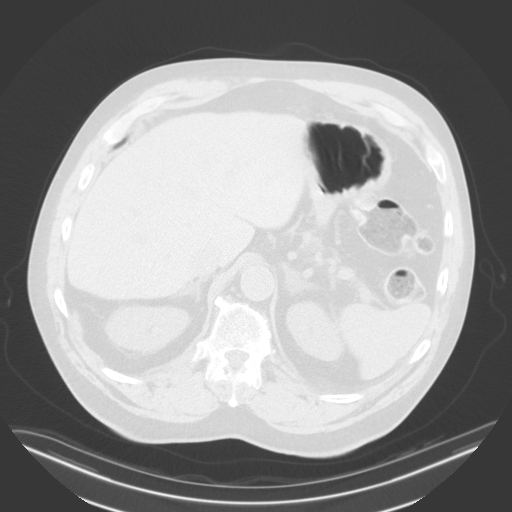
[im 72/87  lung]
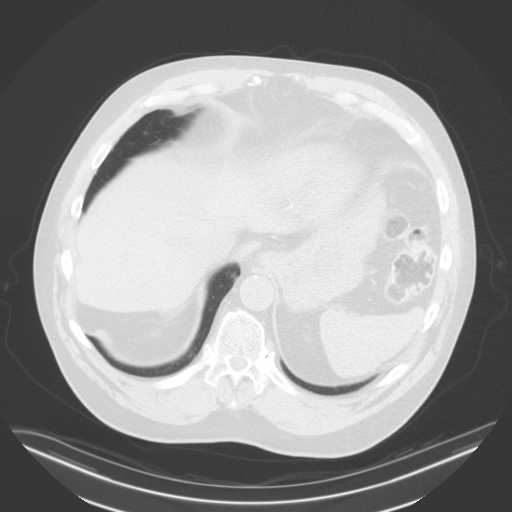
[im 77/87  soft-tissue]
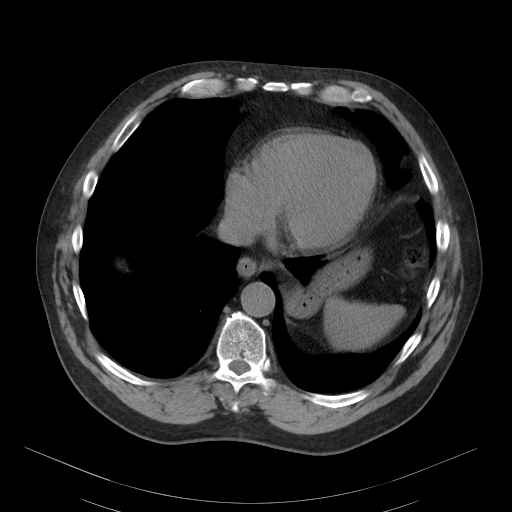
[im 77/87  lung]
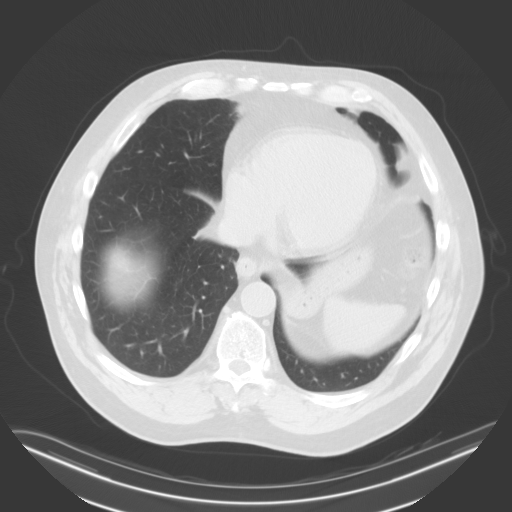
[im 82/87  soft-tissue]
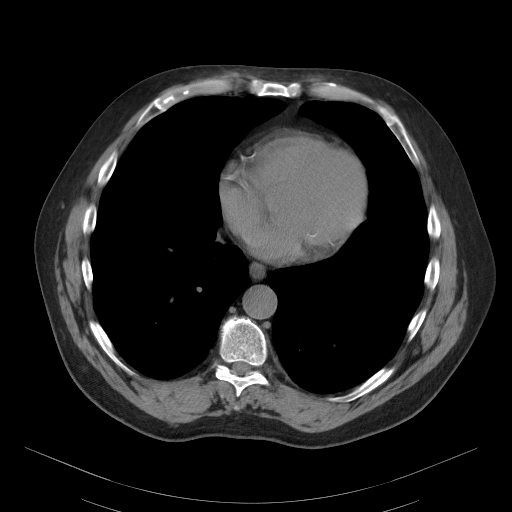
[im 82/87  lung]
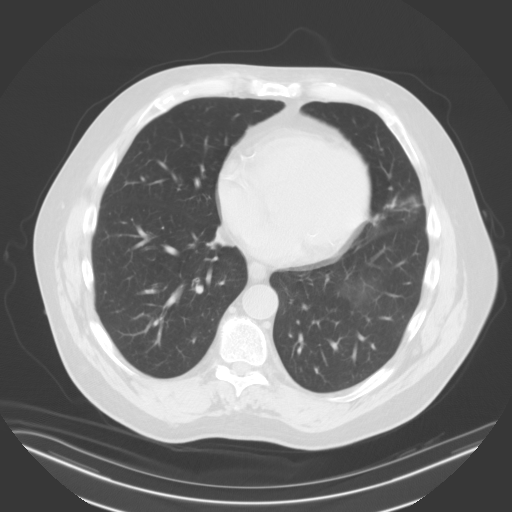

[14 of 32 positions shown; findings below may reference images not displayed]

FINDINGS: Lower chest: No acute abnormality.

Hepatobiliary: No focal liver abnormality is seen. No gallstones,
gallbladder wall thickening, or biliary dilatation.

Pancreas: Unremarkable. No pancreatic ductal dilatation or
surrounding inflammatory changes.

Spleen: Normal in size without focal abnormality.

Adrenals/Urinary Tract: Right adrenal gland appears normal. 2.4 cm
left adrenal nodule is noted. No hydronephrosis or renal obstruction
is noted. No renal or ureteral calculi are noted. Urinary bladder is
unremarkable.

Stomach/Bowel: Stomach is within normal limits. Appendix appears
normal. No evidence of bowel wall thickening, distention, or
inflammatory changes.

Vascular/Lymphatic: Aortic atherosclerosis. No enlarged abdominal or
pelvic lymph nodes.

Reproductive: Prostatic calcifications are noted. Normal prostate
size.

Other: No abdominal wall hernia or abnormality. No abdominopelvic
ascites.

Musculoskeletal: Grade 1 anterolisthesis of L5-S1 is noted secondary
to bilateral pars defects of L5. Multilevel degenerative disc
disease is noted. No acute osseous abnormality is noted.
IMPRESSION: 2.4 cm left adrenal nodule is noted. Follow-up CT or MRI scan in 12
months is recommended to ensure stability and rule out neoplasm.

Grade 1 anterolisthesis of L5-S1 is noted secondary to bilateral
pars defects of L5.

No other significant abnormality seen in the abdomen or pelvis.

Aortic Atherosclerosis ([id number]).

## 2020-08-19 DIAGNOSIS — R55 Syncope and collapse: Secondary | ICD-10-CM | POA: Diagnosis not present

## 2020-08-19 DIAGNOSIS — R5382 Chronic fatigue, unspecified: Secondary | ICD-10-CM | POA: Diagnosis not present

## 2020-08-19 DIAGNOSIS — Z23 Encounter for immunization: Secondary | ICD-10-CM | POA: Diagnosis not present

## 2020-08-19 DIAGNOSIS — R399 Unspecified symptoms and signs involving the genitourinary system: Secondary | ICD-10-CM | POA: Diagnosis not present

## 2020-09-02 ENCOUNTER — Other Ambulatory Visit: Payer: Self-pay | Admitting: Family Medicine

## 2020-09-02 ENCOUNTER — Other Ambulatory Visit (HOSPITAL_COMMUNITY): Payer: Self-pay | Admitting: Family Medicine

## 2020-09-02 DIAGNOSIS — R55 Syncope and collapse: Secondary | ICD-10-CM

## 2020-09-09 ENCOUNTER — Ambulatory Visit
Admission: RE | Admit: 2020-09-09 | Discharge: 2020-09-09 | Disposition: A | Discharge status: Home / Self Care | Payer: Medicare Other | Source: Ambulatory Visit | Attending: Family Medicine | Admitting: Family Medicine

## 2020-09-09 ENCOUNTER — Other Ambulatory Visit (HOSPITAL_COMMUNITY): Payer: Self-pay | Admitting: Family Medicine

## 2020-09-09 DIAGNOSIS — R55 Syncope and collapse: Secondary | ICD-10-CM | POA: Diagnosis not present

## 2020-09-09 DIAGNOSIS — I10 Essential (primary) hypertension: Secondary | ICD-10-CM | POA: Diagnosis not present

## 2020-09-09 DIAGNOSIS — I6523 Occlusion and stenosis of bilateral carotid arteries: Secondary | ICD-10-CM | POA: Diagnosis not present

## 2020-09-10 ENCOUNTER — Other Ambulatory Visit: Payer: Self-pay

## 2020-09-10 ENCOUNTER — Ambulatory Visit
Admission: RE | Admit: 2020-09-10 | Discharge: 2020-09-10 | Disposition: A | Discharge status: Home / Self Care | Payer: Medicare Other | Source: Ambulatory Visit | Attending: Family Medicine | Admitting: Family Medicine

## 2020-09-10 DIAGNOSIS — R55 Syncope and collapse: Secondary | ICD-10-CM

## 2020-09-10 DIAGNOSIS — Z135 Encounter for screening for eye and ear disorders: Secondary | ICD-10-CM | POA: Diagnosis not present

## 2020-09-10 DIAGNOSIS — Z01818 Encounter for other preprocedural examination: Secondary | ICD-10-CM | POA: Diagnosis not present

## 2020-09-10 DIAGNOSIS — G319 Degenerative disease of nervous system, unspecified: Secondary | ICD-10-CM | POA: Diagnosis not present

## 2020-09-14 ENCOUNTER — Inpatient Hospital Stay (HOSPITAL_COMMUNITY): Admission: RE | Admit: 2020-09-14 | Payer: Medicare Other | Source: Ambulatory Visit

## 2020-09-16 DIAGNOSIS — I10 Essential (primary) hypertension: Secondary | ICD-10-CM | POA: Diagnosis not present

## 2020-09-17 ENCOUNTER — Other Ambulatory Visit (HOSPITAL_COMMUNITY)
Admission: RE | Admit: 2020-09-17 | Discharge: 2020-09-17 | Disposition: A | Discharge status: Home / Self Care | Payer: Medicare Other | Source: Ambulatory Visit | Attending: Cardiovascular Disease | Admitting: Cardiovascular Disease

## 2020-09-17 ENCOUNTER — Encounter: Payer: Self-pay | Admitting: Cardiology

## 2020-09-17 ENCOUNTER — Telehealth (HOSPITAL_COMMUNITY): Payer: Self-pay | Admitting: *Deleted

## 2020-09-17 ENCOUNTER — Other Ambulatory Visit (HOSPITAL_COMMUNITY): Payer: Medicare Other

## 2020-09-17 DIAGNOSIS — Z20822 Contact with and (suspected) exposure to covid-19: Secondary | ICD-10-CM | POA: Diagnosis not present

## 2020-09-17 DIAGNOSIS — Z01812 Encounter for preprocedural laboratory examination: Secondary | ICD-10-CM | POA: Insufficient documentation

## 2020-09-17 LAB — SARS CORONAVIRUS 2 (TAT 6-24 HRS): SARS Coronavirus 2: NEGATIVE

## 2020-09-17 NOTE — Telephone Encounter (Signed)
Close encounter 

## 2020-09-17 NOTE — Progress Notes (Unsigned)
Patient ID: Michael Ballard, male   DOB: 01/03/1951, 69 y.o.   MRN: [id number]   Verified appointment "no show" status with Caryl Pina and Elmo Putt at 2:58NI.

## 2020-09-21 ENCOUNTER — Ambulatory Visit (HOSPITAL_COMMUNITY)
Admission: RE | Admit: 2020-09-21 | Payer: PRIVATE HEALTH INSURANCE | Source: Ambulatory Visit | Attending: Family Medicine | Admitting: Family Medicine

## 2020-09-22 DIAGNOSIS — M19211 Secondary osteoarthritis, right shoulder: Secondary | ICD-10-CM | POA: Diagnosis not present

## 2020-09-22 DIAGNOSIS — M19212 Secondary osteoarthritis, left shoulder: Secondary | ICD-10-CM | POA: Diagnosis not present

## 2020-09-24 ENCOUNTER — Telehealth (HOSPITAL_COMMUNITY): Payer: Self-pay | Admitting: Family Medicine

## 2020-09-24 NOTE — Telephone Encounter (Signed)
Patient does not want to have GXT due to his concerns of alling and does not wish to reschedule GXT. I asked him to reach out to Dr. Justin Mend and let her know and that she may possibly order another kind of stress test for him. He did state that he would be here for his echocardiogram. Order will be removed from the [facility] for the GXT. Thank you.

## 2020-10-07 ENCOUNTER — Ambulatory Visit (HOSPITAL_COMMUNITY): Payer: Medicare Other | Attending: Cardiology

## 2020-10-07 ENCOUNTER — Other Ambulatory Visit: Payer: Self-pay

## 2020-10-07 DIAGNOSIS — R55 Syncope and collapse: Secondary | ICD-10-CM | POA: Insufficient documentation

## 2020-10-07 LAB — ECHOCARDIOGRAM COMPLETE
Area-P 1/2: 2.99 cm2
S' Lateral: 3.2 cm

## 2020-11-02 DIAGNOSIS — Z23 Encounter for immunization: Secondary | ICD-10-CM | POA: Diagnosis not present

## 2020-11-04 DIAGNOSIS — L814 Other melanin hyperpigmentation: Secondary | ICD-10-CM | POA: Diagnosis not present

## 2020-11-04 DIAGNOSIS — L720 Epidermal cyst: Secondary | ICD-10-CM | POA: Diagnosis not present

## 2020-11-04 DIAGNOSIS — L820 Inflamed seborrheic keratosis: Secondary | ICD-10-CM | POA: Diagnosis not present

## 2020-11-04 DIAGNOSIS — C44519 Basal cell carcinoma of skin of other part of trunk: Secondary | ICD-10-CM | POA: Diagnosis not present

## 2020-11-04 DIAGNOSIS — L821 Other seborrheic keratosis: Secondary | ICD-10-CM | POA: Diagnosis not present

## 2020-11-04 DIAGNOSIS — D485 Neoplasm of uncertain behavior of skin: Secondary | ICD-10-CM | POA: Diagnosis not present

## 2020-11-04 DIAGNOSIS — Z85828 Personal history of other malignant neoplasm of skin: Secondary | ICD-10-CM | POA: Diagnosis not present

## 2020-11-04 DIAGNOSIS — D2371 Other benign neoplasm of skin of right lower limb, including hip: Secondary | ICD-10-CM | POA: Diagnosis not present

## 2020-11-04 DIAGNOSIS — L570 Actinic keratosis: Secondary | ICD-10-CM | POA: Diagnosis not present

## 2021-05-04 DIAGNOSIS — D485 Neoplasm of uncertain behavior of skin: Secondary | ICD-10-CM | POA: Diagnosis not present

## 2021-05-04 DIAGNOSIS — L821 Other seborrheic keratosis: Secondary | ICD-10-CM | POA: Diagnosis not present

## 2021-05-04 DIAGNOSIS — L814 Other melanin hyperpigmentation: Secondary | ICD-10-CM | POA: Diagnosis not present

## 2021-05-04 DIAGNOSIS — Z85828 Personal history of other malignant neoplasm of skin: Secondary | ICD-10-CM | POA: Diagnosis not present

## 2021-05-04 DIAGNOSIS — L570 Actinic keratosis: Secondary | ICD-10-CM | POA: Diagnosis not present

## 2021-05-26 DIAGNOSIS — Z23 Encounter for immunization: Secondary | ICD-10-CM | POA: Diagnosis not present

## 2021-10-14 DIAGNOSIS — Z23 Encounter for immunization: Secondary | ICD-10-CM | POA: Diagnosis not present

## 2021-10-14 DIAGNOSIS — H9122 Sudden idiopathic hearing loss, left ear: Secondary | ICD-10-CM | POA: Diagnosis not present

## 2021-10-14 DIAGNOSIS — H90A22 Sensorineural hearing loss, unilateral, left ear, with restricted hearing on the contralateral side: Secondary | ICD-10-CM | POA: Diagnosis not present

## 2021-10-14 DIAGNOSIS — H9120 Sudden idiopathic hearing loss, unspecified ear: Secondary | ICD-10-CM | POA: Diagnosis not present

## 2021-10-27 DIAGNOSIS — H90A22 Sensorineural hearing loss, unilateral, left ear, with restricted hearing on the contralateral side: Secondary | ICD-10-CM | POA: Diagnosis not present

## 2021-11-03 DIAGNOSIS — Z85828 Personal history of other malignant neoplasm of skin: Secondary | ICD-10-CM | POA: Diagnosis not present

## 2021-11-03 DIAGNOSIS — D1801 Hemangioma of skin and subcutaneous tissue: Secondary | ICD-10-CM | POA: Diagnosis not present

## 2021-11-03 DIAGNOSIS — D2371 Other benign neoplasm of skin of right lower limb, including hip: Secondary | ICD-10-CM | POA: Diagnosis not present

## 2021-11-03 DIAGNOSIS — L814 Other melanin hyperpigmentation: Secondary | ICD-10-CM | POA: Diagnosis not present

## 2021-11-03 DIAGNOSIS — L821 Other seborrheic keratosis: Secondary | ICD-10-CM | POA: Diagnosis not present

## 2021-11-03 DIAGNOSIS — D485 Neoplasm of uncertain behavior of skin: Secondary | ICD-10-CM | POA: Diagnosis not present

## 2021-11-03 DIAGNOSIS — L570 Actinic keratosis: Secondary | ICD-10-CM | POA: Diagnosis not present

## 2022-05-02 DIAGNOSIS — M79672 Pain in left foot: Secondary | ICD-10-CM | POA: Diagnosis not present

## 2022-05-02 DIAGNOSIS — I2510 Atherosclerotic heart disease of native coronary artery without angina pectoris: Secondary | ICD-10-CM | POA: Diagnosis not present

## 2022-05-02 DIAGNOSIS — Z125 Encounter for screening for malignant neoplasm of prostate: Secondary | ICD-10-CM | POA: Diagnosis not present

## 2022-05-02 DIAGNOSIS — Z23 Encounter for immunization: Secondary | ICD-10-CM | POA: Diagnosis not present

## 2022-05-02 DIAGNOSIS — M79671 Pain in right foot: Secondary | ICD-10-CM | POA: Diagnosis not present

## 2022-05-02 DIAGNOSIS — F17200 Nicotine dependence, unspecified, uncomplicated: Secondary | ICD-10-CM | POA: Diagnosis not present

## 2022-05-02 DIAGNOSIS — Z0000 Encounter for general adult medical examination without abnormal findings: Secondary | ICD-10-CM | POA: Diagnosis not present

## 2022-05-02 DIAGNOSIS — R0602 Shortness of breath: Secondary | ICD-10-CM | POA: Diagnosis not present

## 2022-05-02 DIAGNOSIS — E785 Hyperlipidemia, unspecified: Secondary | ICD-10-CM | POA: Diagnosis not present

## 2022-05-02 DIAGNOSIS — Z122 Encounter for screening for malignant neoplasm of respiratory organs: Secondary | ICD-10-CM | POA: Diagnosis not present

## 2022-05-02 DIAGNOSIS — I10 Essential (primary) hypertension: Secondary | ICD-10-CM | POA: Diagnosis not present

## 2022-05-05 DIAGNOSIS — L821 Other seborrheic keratosis: Secondary | ICD-10-CM | POA: Diagnosis not present

## 2022-05-05 DIAGNOSIS — L814 Other melanin hyperpigmentation: Secondary | ICD-10-CM | POA: Diagnosis not present

## 2022-05-05 DIAGNOSIS — D692 Other nonthrombocytopenic purpura: Secondary | ICD-10-CM | POA: Diagnosis not present

## 2022-05-05 DIAGNOSIS — Z85828 Personal history of other malignant neoplasm of skin: Secondary | ICD-10-CM | POA: Diagnosis not present

## 2022-05-05 DIAGNOSIS — L720 Epidermal cyst: Secondary | ICD-10-CM | POA: Diagnosis not present

## 2022-05-05 DIAGNOSIS — L570 Actinic keratosis: Secondary | ICD-10-CM | POA: Diagnosis not present

## 2022-05-05 DIAGNOSIS — D0462 Carcinoma in situ of skin of left upper limb, including shoulder: Secondary | ICD-10-CM | POA: Diagnosis not present

## 2022-05-05 DIAGNOSIS — D1801 Hemangioma of skin and subcutaneous tissue: Secondary | ICD-10-CM | POA: Diagnosis not present

## 2022-05-05 DIAGNOSIS — D044 Carcinoma in situ of skin of scalp and neck: Secondary | ICD-10-CM | POA: Diagnosis not present

## 2022-05-24 ENCOUNTER — Other Ambulatory Visit: Payer: Self-pay | Admitting: Family Medicine

## 2022-05-24 ENCOUNTER — Other Ambulatory Visit (HOSPITAL_COMMUNITY): Payer: Self-pay | Admitting: Family Medicine

## 2022-05-24 DIAGNOSIS — I10 Essential (primary) hypertension: Secondary | ICD-10-CM | POA: Diagnosis not present

## 2022-05-24 DIAGNOSIS — Z87891 Personal history of nicotine dependence: Secondary | ICD-10-CM

## 2022-05-24 DIAGNOSIS — F17210 Nicotine dependence, cigarettes, uncomplicated: Secondary | ICD-10-CM | POA: Diagnosis not present

## 2022-05-24 DIAGNOSIS — R0602 Shortness of breath: Secondary | ICD-10-CM | POA: Diagnosis not present

## 2022-06-01 ENCOUNTER — Ambulatory Visit
Admission: RE | Admit: 2022-06-01 | Discharge: 2022-06-01 | Disposition: A | Discharge status: Home / Self Care | Payer: Medicare Other | Source: Ambulatory Visit | Attending: Family Medicine | Admitting: Family Medicine

## 2022-06-01 DIAGNOSIS — Z87891 Personal history of nicotine dependence: Secondary | ICD-10-CM | POA: Diagnosis not present

## 2022-06-01 DIAGNOSIS — F17210 Nicotine dependence, cigarettes, uncomplicated: Secondary | ICD-10-CM | POA: Diagnosis not present

## 2022-07-07 ENCOUNTER — Other Ambulatory Visit: Payer: Self-pay | Admitting: Thoracic Surgery (Cardiothoracic Vascular Surgery)

## 2022-07-07 ENCOUNTER — Encounter: Payer: Self-pay | Admitting: Thoracic Surgery (Cardiothoracic Vascular Surgery)

## 2022-07-07 ENCOUNTER — Institutional Professional Consult (permissible substitution) (INDEPENDENT_AMBULATORY_CARE_PROVIDER_SITE_OTHER): Payer: Medicare Other | Admitting: Thoracic Surgery (Cardiothoracic Vascular Surgery)

## 2022-07-07 VITALS — BP 180/90 | HR 72 | Resp 20 | Ht 71.0 in | Wt 205.0 lb

## 2022-07-07 DIAGNOSIS — M1990 Unspecified osteoarthritis, unspecified site: Secondary | ICD-10-CM | POA: Insufficient documentation

## 2022-07-07 DIAGNOSIS — K5790 Diverticulosis of intestine, part unspecified, without perforation or abscess without bleeding: Secondary | ICD-10-CM | POA: Diagnosis not present

## 2022-07-07 DIAGNOSIS — J986 Disorders of diaphragm: Secondary | ICD-10-CM

## 2022-07-07 DIAGNOSIS — I10 Essential (primary) hypertension: Secondary | ICD-10-CM | POA: Diagnosis not present

## 2022-07-07 DIAGNOSIS — E785 Hyperlipidemia, unspecified: Secondary | ICD-10-CM | POA: Insufficient documentation

## 2022-07-07 DIAGNOSIS — J439 Emphysema, unspecified: Secondary | ICD-10-CM | POA: Diagnosis not present

## 2022-07-07 DIAGNOSIS — R9389 Abnormal findings on diagnostic imaging of other specified body structures: Secondary | ICD-10-CM

## 2022-07-07 HISTORY — DX: Essential (primary) hypertension: I10

## 2022-07-07 NOTE — Progress Notes (Signed)
[address]       Michael Ballard,[place] [address]             [id number]                    Michael Ballard Medical Record #[id number] Date of Birth: 03-31-1951  Referring: Saintclair Halsted, FNP Primary Care: Maurice Small, MD Primary Cardiologist: None  Chief Complaint:    Chief Complaint  Patient presents with   abnormal CT    Surgical consult, CT Chest 06/03/22    History of Present Illness:    Michael Ballard 71 y.o. male referred for incidental finding of bilateral diaphragmatic abnormalities.  There appears to be some thickening and nodularity to it.  He is a lifelong smoker and this was found on his lung cancer screening.  He denies any respiratory complaints.  He denies any history of any trauma.   Past Medical History:  Diagnosis Date   HTN (hypertension) 07/07/2022     No family history on file.   Social History   Tobacco Use  Smoking Status Every Day   Types: Cigarettes  Smokeless Tobacco Never  Tobacco Comments   < 1 ppd    Social History   Substance and Sexual Activity  Alcohol Use Yes   Alcohol/week: 10.0 - 12.0 standard drinks of alcohol   Types: 10 - 12 Cans of beer per week   Comment: 10-12 cans twice a week     No Known Allergies  Current Outpatient Medications  Medication Sig Dispense Refill   amLODipine (NORVASC) 10 MG tablet TK 1 T PO QD  3   BREO ELLIPTA 100-25 MCG/ACT AEPB Inhale 1 puff into the lungs daily.     buPROPion (WELLBUTRIN XL) 150 MG 24 hr tablet TK 1 T PO QAM TO HELP REDUCE SMOKING URGES  7   hydrochlorothiazide (HYDRODIURIL) 12.5 MG tablet Take 12.5 mg by mouth daily. (Patient not taking: Reported on 07/07/2022)     No current facility-administered medications for this visit.    Review of Systems  Constitutional: Negative.   Respiratory: Negative.    Cardiovascular: Negative.   Musculoskeletal:  Positive for back pain, joint pain and myalgias.     PHYSICAL EXAMINATION: BP (!) 180/90    Pulse 72   Resp 20   Ht '5\' 11"'$  (1.803 m)   Wt 205 lb (93 kg)   SpO2 95% Comment: RA  BMI 28.59 kg/m  Physical Exam Constitutional:      General: He is not in acute distress.    Appearance: Normal appearance. He is normal weight. He is not ill-appearing.  Cardiovascular:     Rate and Rhythm: Normal rate.  Pulmonary:     Effort: Pulmonary effort is normal. No respiratory distress.  Abdominal:     General: There is no distension.  Musculoskeletal:     Cervical back: Normal range of motion.  Skin:    General: Skin is warm and dry.  Neurological:     General: No focal deficit present.     Mental Status: He is alert and oriented to person, place, and time.     Diagnostic Studies & Laboratory data:     Recent Radiology Findings:   No results found.     I have independently reviewed the above radiology studies  and reviewed the findings with the patient.   Recent Lab Findings: No results found for: "WBC", "HGB", "HCT", "PLT", "GLUCOSE", "CHOL", "  TRIG", "HDL", "LDLDIRECT", "LDLCALC", "ALT", "AST", "NA", "K", "CL", "CREATININE", "BUN", "CO2", "TSH", "INR", "GLUF", "HGBA1C"     Assessment / Plan:   71 year old male with some dental finding of bilateral diaphragmatic thickening.  I personally reviewed imaging from 2019 and his diaphragm appeared normal without point.  He does have some scarring at his left lung base.  I have ordered an MRI for further evaluation of his diaphragm.  I will follow-up with him after this is resulted.     I  spent 25 minutes with  the patient face to face in counseling and coordination of care.    Lajuana Matte 07/07/2022 1:14 PM

## 2022-07-20 ENCOUNTER — Ambulatory Visit
Admission: RE | Admit: 2022-07-20 | Discharge: 2022-07-20 | Disposition: A | Discharge status: Home / Self Care | Payer: Medicare Other | Source: Ambulatory Visit | Attending: Thoracic Surgery (Cardiothoracic Vascular Surgery) | Admitting: Thoracic Surgery (Cardiothoracic Vascular Surgery)

## 2022-07-20 DIAGNOSIS — J986 Disorders of diaphragm: Secondary | ICD-10-CM

## 2022-07-20 DIAGNOSIS — D3502 Benign neoplasm of left adrenal gland: Secondary | ICD-10-CM | POA: Diagnosis not present

## 2022-07-20 DIAGNOSIS — R9389 Abnormal findings on diagnostic imaging of other specified body structures: Secondary | ICD-10-CM

## 2022-07-20 MED ORDER — GADOBENATE DIMEGLUMINE 529 MG/ML IV SOLN
19.0000 mL | Freq: Once | INTRAVENOUS | Status: AC | PRN
  Administered 2022-07-20: 19 mL via INTRAVENOUS

## 2022-07-24 ENCOUNTER — Inpatient Hospital Stay
Admission: RE | Admit: 2022-07-24 | Discharge: 2022-07-24 | Disposition: A | Discharge status: Home / Self Care | Payer: Medicare Other | Source: Ambulatory Visit | Attending: Family Medicine | Admitting: Family Medicine

## 2022-07-24 ENCOUNTER — Ambulatory Visit
Admission: RE | Admit: 2022-07-24 | Discharge: 2022-07-24 | Disposition: A | Discharge status: Home / Self Care | Payer: Medicare Other | Source: Ambulatory Visit | Attending: Thoracic Surgery (Cardiothoracic Vascular Surgery) | Admitting: Thoracic Surgery (Cardiothoracic Vascular Surgery)

## 2022-07-24 ENCOUNTER — Other Ambulatory Visit: Payer: Self-pay | Admitting: Thoracic Surgery (Cardiothoracic Vascular Surgery)

## 2022-07-24 DIAGNOSIS — R9389 Abnormal findings on diagnostic imaging of other specified body structures: Secondary | ICD-10-CM

## 2022-07-24 DIAGNOSIS — J986 Disorders of diaphragm: Secondary | ICD-10-CM

## 2022-07-24 MED ORDER — GADOBENATE DIMEGLUMINE 529 MG/ML IV SOLN
19.0000 mL | Freq: Once | INTRAVENOUS | Status: AC | PRN
  Administered 2022-07-24: 19 mL via INTRAVENOUS

## 2022-08-04 ENCOUNTER — Ambulatory Visit (INDEPENDENT_AMBULATORY_CARE_PROVIDER_SITE_OTHER): Payer: Medicare Other | Admitting: Thoracic Surgery (Cardiothoracic Vascular Surgery)

## 2022-08-04 DIAGNOSIS — K449 Diaphragmatic hernia without obstruction or gangrene: Secondary | ICD-10-CM

## 2022-08-04 NOTE — Progress Notes (Signed)
     [address]       ,[place] [address]             [phone]       Patient: Home Provider: Office Consent for Telemedicine visit obtained.  Today's visit was completed via a real-time telehealth (see specific modality noted below). The patient/authorized person provided oral consent at the time of the visit to engage in a telemedicine encounter with the present provider at [facility]. The patient/authorized person was informed of the potential benefits, limitations, and risks of telemedicine. The patient/authorized person expressed understanding that the laws that protect confidentiality also apply to telemedicine. The patient/authorized person acknowledged understanding that telemedicine does not provide emergency services and that he or she would need to call 911 or proceed to the nearest hospital for help if such a need arose.   Total time spent in the clinical discussion 10 minutes.  Telehealth Modality: Phone visit (audio only)  I had a telephone visit with Michael Ballard.  We discussed the results of his most recent MRI.  He was originally seen for abnormalities that were noted along his diaphragm.  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  Patient states that he is completely asymptomatic thus I do not think that he needs any surgical intervention at this point.  If his hernia becomes bigger or if he starts developing symptoms then he will let us know and we will discuss surgical options.   FINDINGS: Lower chest: Discontinuity is seen in the mid and lateral left hemidiaphragm, with a small diaphragmatic hernia which contains only fat. Thickening of the intact portions of the left hemidiaphragm are seen. 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 No evidence of herniation of other intra-abdominal contents. No evidence of pleural effusion. No evidence defect or hernia involving  the right hemidiaphragm.

## 2022-08-24 DIAGNOSIS — J45909 Unspecified asthma, uncomplicated: Secondary | ICD-10-CM | POA: Diagnosis not present

## 2022-08-24 DIAGNOSIS — F17200 Nicotine dependence, unspecified, uncomplicated: Secondary | ICD-10-CM | POA: Diagnosis not present

## 2022-08-24 DIAGNOSIS — I10 Essential (primary) hypertension: Secondary | ICD-10-CM | POA: Diagnosis not present

## 2022-08-24 DIAGNOSIS — M79671 Pain in right foot: Secondary | ICD-10-CM | POA: Diagnosis not present

## 2022-08-24 DIAGNOSIS — K449 Diaphragmatic hernia without obstruction or gangrene: Secondary | ICD-10-CM | POA: Diagnosis not present

## 2022-09-01 DIAGNOSIS — M25572 Pain in left ankle and joints of left foot: Secondary | ICD-10-CM | POA: Diagnosis not present

## 2022-11-01 DIAGNOSIS — Z86010 Personal history of colonic polyps: Secondary | ICD-10-CM | POA: Diagnosis not present

## 2022-11-01 DIAGNOSIS — K5730 Diverticulosis of large intestine without perforation or abscess without bleeding: Secondary | ICD-10-CM | POA: Diagnosis not present

## 2022-11-01 DIAGNOSIS — K648 Other hemorrhoids: Secondary | ICD-10-CM | POA: Diagnosis not present

## 2022-11-01 DIAGNOSIS — Z09 Encounter for follow-up examination after completed treatment for conditions other than malignant neoplasm: Secondary | ICD-10-CM | POA: Diagnosis not present

## 2022-11-06 DIAGNOSIS — D225 Melanocytic nevi of trunk: Secondary | ICD-10-CM | POA: Diagnosis not present

## 2022-11-06 DIAGNOSIS — L570 Actinic keratosis: Secondary | ICD-10-CM | POA: Diagnosis not present

## 2022-11-06 DIAGNOSIS — L814 Other melanin hyperpigmentation: Secondary | ICD-10-CM | POA: Diagnosis not present

## 2022-11-06 DIAGNOSIS — Z85828 Personal history of other malignant neoplasm of skin: Secondary | ICD-10-CM | POA: Diagnosis not present

## 2022-11-27 DIAGNOSIS — J40 Bronchitis, not specified as acute or chronic: Secondary | ICD-10-CM | POA: Diagnosis not present

## 2022-11-27 DIAGNOSIS — R0981 Nasal congestion: Secondary | ICD-10-CM | POA: Diagnosis not present

## 2022-11-27 DIAGNOSIS — R059 Cough, unspecified: Secondary | ICD-10-CM | POA: Diagnosis not present

## 2022-11-27 DIAGNOSIS — R0609 Other forms of dyspnea: Secondary | ICD-10-CM | POA: Diagnosis not present

## 2022-11-27 DIAGNOSIS — R5383 Other fatigue: Secondary | ICD-10-CM | POA: Diagnosis not present

## 2022-11-27 DIAGNOSIS — Z03818 Encounter for observation for suspected exposure to other biological agents ruled out: Secondary | ICD-10-CM | POA: Diagnosis not present

## 2023-05-08 DIAGNOSIS — Z85828 Personal history of other malignant neoplasm of skin: Secondary | ICD-10-CM | POA: Diagnosis not present

## 2023-05-08 DIAGNOSIS — B078 Other viral warts: Secondary | ICD-10-CM | POA: Diagnosis not present

## 2023-05-08 DIAGNOSIS — C44729 Squamous cell carcinoma of skin of left lower limb, including hip: Secondary | ICD-10-CM | POA: Diagnosis not present

## 2023-05-08 DIAGNOSIS — L821 Other seborrheic keratosis: Secondary | ICD-10-CM | POA: Diagnosis not present

## 2023-05-08 DIAGNOSIS — L570 Actinic keratosis: Secondary | ICD-10-CM | POA: Diagnosis not present

## 2023-05-08 DIAGNOSIS — D1801 Hemangioma of skin and subcutaneous tissue: Secondary | ICD-10-CM | POA: Diagnosis not present

## 2023-05-08 DIAGNOSIS — C4441 Basal cell carcinoma of skin of scalp and neck: Secondary | ICD-10-CM | POA: Diagnosis not present

## 2023-05-08 DIAGNOSIS — L814 Other melanin hyperpigmentation: Secondary | ICD-10-CM | POA: Diagnosis not present

## 2023-05-15 DIAGNOSIS — Z125 Encounter for screening for malignant neoplasm of prostate: Secondary | ICD-10-CM | POA: Diagnosis not present

## 2023-05-15 DIAGNOSIS — I2510 Atherosclerotic heart disease of native coronary artery without angina pectoris: Secondary | ICD-10-CM | POA: Diagnosis not present

## 2023-05-15 DIAGNOSIS — I10 Essential (primary) hypertension: Secondary | ICD-10-CM | POA: Diagnosis not present

## 2023-05-15 DIAGNOSIS — E785 Hyperlipidemia, unspecified: Secondary | ICD-10-CM | POA: Diagnosis not present

## 2023-05-15 DIAGNOSIS — Z0000 Encounter for general adult medical examination without abnormal findings: Secondary | ICD-10-CM | POA: Diagnosis not present

## 2023-05-15 DIAGNOSIS — J439 Emphysema, unspecified: Secondary | ICD-10-CM | POA: Diagnosis not present

## 2023-05-15 DIAGNOSIS — F17210 Nicotine dependence, cigarettes, uncomplicated: Secondary | ICD-10-CM | POA: Diagnosis not present

## 2023-05-15 DIAGNOSIS — Z1159 Encounter for screening for other viral diseases: Secondary | ICD-10-CM | POA: Diagnosis not present

## 2023-05-15 DIAGNOSIS — I700 Atherosclerosis of aorta: Secondary | ICD-10-CM | POA: Diagnosis not present

## 2023-06-12 DIAGNOSIS — M7022 Olecranon bursitis, left elbow: Secondary | ICD-10-CM | POA: Diagnosis not present

## 2023-06-12 DIAGNOSIS — M7021 Olecranon bursitis, right elbow: Secondary | ICD-10-CM | POA: Diagnosis not present

## 2023-07-16 DIAGNOSIS — I10 Essential (primary) hypertension: Secondary | ICD-10-CM | POA: Diagnosis not present

## 2023-07-16 DIAGNOSIS — J439 Emphysema, unspecified: Secondary | ICD-10-CM | POA: Diagnosis not present

## 2023-07-16 DIAGNOSIS — M25422 Effusion, left elbow: Secondary | ICD-10-CM | POA: Diagnosis not present

## 2023-08-17 DIAGNOSIS — J439 Emphysema, unspecified: Secondary | ICD-10-CM | POA: Diagnosis not present

## 2023-08-17 DIAGNOSIS — I10 Essential (primary) hypertension: Secondary | ICD-10-CM | POA: Diagnosis not present

## 2023-08-17 DIAGNOSIS — F17200 Nicotine dependence, unspecified, uncomplicated: Secondary | ICD-10-CM | POA: Diagnosis not present

## 2023-11-12 DIAGNOSIS — D0461 Carcinoma in situ of skin of right upper limb, including shoulder: Secondary | ICD-10-CM | POA: Diagnosis not present

## 2023-11-12 DIAGNOSIS — L814 Other melanin hyperpigmentation: Secondary | ICD-10-CM | POA: Diagnosis not present

## 2023-11-12 DIAGNOSIS — L570 Actinic keratosis: Secondary | ICD-10-CM | POA: Diagnosis not present

## 2023-11-12 DIAGNOSIS — L821 Other seborrheic keratosis: Secondary | ICD-10-CM | POA: Diagnosis not present

## 2023-11-12 DIAGNOSIS — L720 Epidermal cyst: Secondary | ICD-10-CM | POA: Diagnosis not present

## 2023-11-12 DIAGNOSIS — Z85828 Personal history of other malignant neoplasm of skin: Secondary | ICD-10-CM | POA: Diagnosis not present

## 2023-11-27 DIAGNOSIS — Z23 Encounter for immunization: Secondary | ICD-10-CM | POA: Diagnosis not present

## 2023-11-27 DIAGNOSIS — J439 Emphysema, unspecified: Secondary | ICD-10-CM | POA: Diagnosis not present

## 2023-11-27 DIAGNOSIS — F17210 Nicotine dependence, cigarettes, uncomplicated: Secondary | ICD-10-CM | POA: Diagnosis not present

## 2023-11-27 DIAGNOSIS — I10 Essential (primary) hypertension: Secondary | ICD-10-CM | POA: Diagnosis not present

## 2024-06-25 ENCOUNTER — Other Ambulatory Visit: Payer: Self-pay | Admitting: Family Medicine

## 2024-06-25 ENCOUNTER — Encounter: Payer: Self-pay | Admitting: Family Medicine

## 2024-06-25 DIAGNOSIS — Z87891 Personal history of nicotine dependence: Secondary | ICD-10-CM

## 2024-07-02 ENCOUNTER — Ambulatory Visit
Admission: RE | Admit: 2024-07-02 | Discharge: 2024-07-02 | Disposition: A | Discharge status: Home / Self Care | Source: Ambulatory Visit | Attending: Family Medicine | Admitting: Family Medicine

## 2024-07-02 DIAGNOSIS — Z87891 Personal history of nicotine dependence: Secondary | ICD-10-CM

## 2024-10-02 ENCOUNTER — Encounter: Payer: Self-pay | Admitting: Pulmonary Disease

## 2024-10-02 ENCOUNTER — Ambulatory Visit: Admitting: Pulmonary Disease

## 2024-10-02 VITALS — BP 122/82 | HR 66 | Temp 97.4°F | Ht 71.0 in | Wt 216.0 lb

## 2024-10-02 DIAGNOSIS — J4489 Other specified chronic obstructive pulmonary disease: Secondary | ICD-10-CM

## 2024-10-02 DIAGNOSIS — J439 Emphysema, unspecified: Secondary | ICD-10-CM

## 2024-10-02 MED ORDER — ALBUTEROL SULFATE HFA 108 (90 BASE) MCG/ACT IN AERS: 2.0000 | INHALATION_SPRAY | Freq: Four times a day (QID) | RESPIRATORY_TRACT | 6 refills | Status: AC | PRN

## 2024-10-02 MED ORDER — BREZTRI AEROSPHERE 160-9-4.8 MCG/ACT IN AERO: 2.0000 | INHALATION_SPRAY | Freq: Two times a day (BID) | RESPIRATORY_TRACT | 0 refills | Status: AC

## 2024-10-02 NOTE — Progress Notes (Signed)
 Synopsis: Referred in by Dyane Anthony RAMAN, FNP   Subjective:   PATIENT ID: Michael Ballard GENDER: male DOB: 11/03/1951, MRN: [id number]  Chief Complaint  Patient presents with   Consult    DOE. Wheezing. No cough.     HPI Discussed the use of AI scribe software for clinical note transcription with the patient, who gave verbal consent to proceed.  History of Present Illness   Michael Ballard is a 72 year old male who presents with mucus problems and shortness of breath.  He experiences mucus problems and occasional shortness of breath, which is improving for reasons unknown to him. He uses a tincture made from [facility] leaves, which he believes helps loosen his mucus. He does not have a persistent cough, but mucus sometimes comes up unexpectedly.  Significant shortness of breath occurs when walking uphill, and he had difficulty mowing the lawn during the summer due to breathlessness, although this has improved recently. No chest pain or chest tightness. He has a history of smoking less than a pack a day for 35 to 40 years but quit 11 months ago.  He lives with his wife and has two small dogs. No known seasonal allergies or itchy skin rashes, although he bruises easily with minor bumps. He visits a dermatologist twice a year.  He has a history of Elbow bursitis, with two bursa sacs that have been drained three times. One remains swollen and may require surgical intervention. He has flat feet, which he attributes to working on concrete and playing sports, contributing to his difficulty walking uphill.       History reviewed. No pertinent family history.   Social History   Socioeconomic History   Marital status: Married    Spouse name: Not on file   Number of children: Not on file   Years of education: Not on file   Highest education level: Not on file  Occupational History   Not on file  Tobacco Use   Smoking status: Former    Types: Cigarettes   Smokeless tobacco:  Never   Tobacco comments:    Quit in 2024        Started smoking in his 36's    Smoked 15 cigarettes a day at his heaviest  Substance and Sexual Activity   Alcohol use: Yes    Alcohol/week: 10.0 - 12.0 standard drinks of alcohol    Types: 10 - 12 Cans of beer per week    Comment: 10-12 cans twice a week   Drug use: Yes    Types: Marijuana   Sexual activity: Not on file  Other Topics Concern   Not on file  Social History Narrative   Not on file   Social Drivers of Health   Financial Resource Strain: Not on file  Food Insecurity: Not on file  Transportation Needs: Not on file  Physical Activity: Not on file  Stress: Not on file  Social Connections: Not on file  Intimate Partner Violence: Not on file        Objective:   Vitals:   10/02/24 0841  BP: 122/82  Pulse: 66  Temp: (!) 97.4 F (36.3 C)  SpO2: 97%  Weight: 216 lb (98 kg)  Height: 5' 11 (1.803 m)   97% on RA BMI Readings from Last 3 Encounters:  10/02/24 30.13 kg/m  07/07/22 28.59 kg/m  06/01/22 27.34 kg/m   Wt Readings from Last 3 Encounters:  10/02/24 216 lb (98 kg)  07/07/22 205 lb (  93 kg)  06/01/22 196 lb (88.9 kg)    Physical Exam GEN: NAD, Healthy Appearing HEENT: Supple Neck, Reactive Pupils, EOMI  CVS: Normal S1, Normal S2, RRR, No murmurs or ES appreciated  Lungs: Expiratory wheezing appreciated.  Abdomen: Soft, non tender, non distended, + BS  Extremities: Warm and well perfused, No edema   Labs and imaging were reviewed.  Ancillary Information   CBC No results found for: WBC, RBC, HGB, HCT, PLT, MCV, MCH, MCHC, RDW, LYMPHSABS, MONOABS, EOSABS, BASOSABS     Latest Ref Rng & Units 10/02/2014    3:27 PM  PFT Results  FVC-Pre L 3.21   FVC-Predicted Pre % 67   FVC-Post L 3.68   FVC-Predicted Post % 77   Pre FEV1/FVC % % 57   Post FEV1/FCV % % 62   FEV1-Pre L 1.82   FEV1-Predicted Pre % 51   FEV1-Post L 2.26   DLCO uncorrected ml/min/mmHg 27.58    DLCO UNC% % 83   DLVA Predicted % 94      Assessment & Plan:  Assessment and Plan    #Chronic obstructive pulmonary disease (COPD) Suspected COPD due to smoking, wheezing, and dyspnea. CT scan unremarkable. COPD likely given smoking history and clinical presentation. - Order pulmonary function test to confirm diagnosis. - Prescribe albuterol inhaler, two puffs every six hours as needed for chest tightness or dyspnea.  #Tobacco use  Quit 11 months ago. Congratulated him.  Already enrolled in lung cancer screening program.     Return in about 3 months (around 01/02/2025).  I personally spent a total of 60 minutes in the care of the patient today including preparing to see the patient, getting/reviewing separately obtained history, performing a medically appropriate exam/evaluation, counseling and educating, placing orders, documenting clinical information in the EHR, independently interpreting results, and communicating results.   Darrin Barn, MD [facility] 10/02/2024 9:03 AM
